# Patient Record
Sex: Male | Born: 1990
Health system: Southern US, Community
[De-identification: ages and names within clinical notes are randomized; demographics above are authoritative.]

## PROBLEM LIST (undated history)

## (undated) DIAGNOSIS — S82101B Unspecified fracture of upper end of right tibia, initial encounter for open fracture type I or II: Secondary | ICD-10-CM

## (undated) DIAGNOSIS — F32A Depression, unspecified: Secondary | ICD-10-CM

## (undated) DIAGNOSIS — S7292XA Unspecified fracture of left femur, initial encounter for closed fracture: Secondary | ICD-10-CM

## (undated) DIAGNOSIS — S82452A Displaced comminuted fracture of shaft of left fibula, initial encounter for closed fracture: Secondary | ICD-10-CM

## (undated) DIAGNOSIS — Z889 Allergy status to unspecified drugs, medicaments and biological substances status: Secondary | ICD-10-CM

## (undated) DIAGNOSIS — T8859XA Other complications of anesthesia, initial encounter: Secondary | ICD-10-CM

## (undated) DIAGNOSIS — Z973 Presence of spectacles and contact lenses: Secondary | ICD-10-CM

## (undated) DIAGNOSIS — T4145XA Adverse effect of unspecified anesthetic, initial encounter: Secondary | ICD-10-CM

## (undated) DIAGNOSIS — K219 Gastro-esophageal reflux disease without esophagitis: Secondary | ICD-10-CM

## (undated) DIAGNOSIS — F329 Major depressive disorder, single episode, unspecified: Secondary | ICD-10-CM

## (undated) DIAGNOSIS — F909 Attention-deficit hyperactivity disorder, unspecified type: Secondary | ICD-10-CM

## (undated) DIAGNOSIS — F419 Anxiety disorder, unspecified: Secondary | ICD-10-CM

## (undated) DIAGNOSIS — I10 Essential (primary) hypertension: Secondary | ICD-10-CM

## (undated) DIAGNOSIS — B86 Scabies: Secondary | ICD-10-CM

## (undated) HISTORY — DX: Attention-deficit hyperactivity disorder, unspecified type: F90.9

## (undated) HISTORY — DX: Depression, unspecified: F32.A

## (undated) HISTORY — DX: Anxiety disorder, unspecified: F41.9

## (undated) HISTORY — DX: Essential (primary) hypertension: I10

## (undated) HISTORY — PX: LEG SURGERY: SHX1003

## (undated) HISTORY — PX: WISDOM TOOTH EXTRACTION: SHX21

## (undated) HISTORY — DX: Scabies: B86

---
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
## 2011-12-24 ENCOUNTER — Ambulatory Visit (INDEPENDENT_AMBULATORY_CARE_PROVIDER_SITE_OTHER): Payer: 59 | Admitting: Family Medicine

## 2011-12-24 VITALS — BP 136/79 | HR 92 | Temp 97.9°F | Resp 18 | Ht 72.0 in | Wt 170.0 lb

## 2011-12-24 DIAGNOSIS — J45991 Cough variant asthma: Secondary | ICD-10-CM

## 2011-12-24 DIAGNOSIS — J019 Acute sinusitis, unspecified: Secondary | ICD-10-CM

## 2011-12-24 DIAGNOSIS — J45909 Unspecified asthma, uncomplicated: Secondary | ICD-10-CM

## 2011-12-24 DIAGNOSIS — Q676 Pectus excavatum: Secondary | ICD-10-CM

## 2011-12-24 DIAGNOSIS — I10 Essential (primary) hypertension: Secondary | ICD-10-CM

## 2011-12-24 MED ORDER — AMOXICILLIN 875 MG PO TABS: 875.0000 mg | ORAL_TABLET | Freq: Two times a day (BID) | ORAL | Status: AC

## 2011-12-24 MED ORDER — FLUTICASONE PROPIONATE 50 MCG/ACT NA SUSP: NASAL | Status: DC

## 2011-12-24 MED ORDER — ALBUTEROL SULFATE HFA 108 (90 BASE) MCG/ACT IN AERS: 2.0000 | INHALATION_SPRAY | Freq: Four times a day (QID) | RESPIRATORY_TRACT | Status: DC | PRN

## 2011-12-24 NOTE — Progress Notes (Signed)
  Subjective:    Patient ID: Noah Duke, male    DOB: [birth year], 21 y.o.   MRN: [id number]  HPI    Review of Systems     Objective:   Physical Exam        Assessment & Plan:

## 2011-12-24 NOTE — Patient Instructions (Addendum)
Take medications as ordered. Drink lots of fluids. Return if worse. Advise considering going to one of the specialty centers to have the pectus excavatum evaluated some time.

## 2011-12-24 NOTE — Progress Notes (Signed)
Subjective: Patient has been having problems with an upper respiratory congestion for about 10 days. He blood a lot of mucus out. His ears are uncomfortable. His though does have a lot of drainage down. He has been wheezing and coughing a lot.  He also a lot of questions about his pectus excavatum. He has read up on it. He does look more prominent and bothering him more since he has been lifting weights and developing more pectoralis muscles.  Objective: This white male obviously uncomfortable with a lot of nasal drainage coughing and blowing his nose. His TMs are normal. Nose congested. Throat clear. Wears braces. Neck supple without significant nodes. Chest is clear to percussion and auscultation there is wheezing bilaterally though he has fairly good air exchange.  Prominent pectus excavatum very deep at the low sternum.  Assessment: Asthmatic bronchitis Sinusitis Pectus excavatum  Plan: Discussed with patient the pectus. Advised him considering going to a specialist about the [facility] some time. He can research online and find someone who has experience with this. If he cannot find such she is to get back to me.  We'll treat his symptoms with antibiotics nasal inhalers and pulmonary inhalers.

## 2012-09-30 ENCOUNTER — Other Ambulatory Visit: Payer: Self-pay | Admitting: Internal Medicine

## 2012-09-30 ENCOUNTER — Other Ambulatory Visit: Payer: 59 | Admitting: Internal Medicine

## 2012-09-30 DIAGNOSIS — Z0000 Encounter for general adult medical examination without abnormal findings: Secondary | ICD-10-CM

## 2012-09-30 LAB — COMPREHENSIVE METABOLIC PANEL
Albumin: 4.6 g/dL (ref 3.5–5.2)
BUN: 17 mg/dL (ref 6–23)
Calcium: 9.9 mg/dL (ref 8.4–10.5)
Chloride: 101 mEq/L (ref 96–112)
Glucose, Bld: 65 mg/dL — ABNORMAL LOW (ref 70–99)
Potassium: 4.2 mEq/L (ref 3.5–5.3)

## 2012-09-30 LAB — LIPID PANEL
Cholesterol: 161 mg/dL (ref 0–200)
HDL: 57 mg/dL (ref >39)
Triglycerides: 53 mg/dL (ref <150)

## 2012-10-01 ENCOUNTER — Encounter: Payer: Self-pay | Admitting: Internal Medicine

## 2012-10-01 ENCOUNTER — Ambulatory Visit (INDEPENDENT_AMBULATORY_CARE_PROVIDER_SITE_OTHER): Payer: 59 | Admitting: Internal Medicine

## 2012-10-01 VITALS — BP 128/78 | HR 72 | Temp 98.8°F | Ht 69.75 in | Wt 179.0 lb

## 2012-10-01 DIAGNOSIS — Z202 Contact with and (suspected) exposure to infections with a predominantly sexual mode of transmission: Secondary | ICD-10-CM

## 2012-10-01 DIAGNOSIS — Z23 Encounter for immunization: Secondary | ICD-10-CM

## 2012-10-01 DIAGNOSIS — Z2089 Contact with and (suspected) exposure to other communicable diseases: Secondary | ICD-10-CM

## 2012-10-01 LAB — CBC WITH DIFFERENTIAL/PLATELET
Basophils Relative: 1 % (ref 0–1)
HCT: 40.3 % (ref 39.0–52.0)
Hemoglobin: 13.5 g/dL (ref 13.0–17.0)
Lymphocytes Relative: 42 % (ref 12–46)
MCHC: 33.5 g/dL (ref 30.0–36.0)
Monocytes Absolute: 0.7 10*3/uL (ref 0.1–1.0)
Monocytes Relative: 12 % (ref 3–12)
Neutro Abs: 2.7 10*3/uL (ref 1.7–7.7)

## 2012-10-01 MED ORDER — TETANUS-DIPHTH-ACELL PERTUSSIS 5-2.5-18.5 LF-MCG/0.5 IM SUSP: 0.5000 mL | Freq: Once | INTRAMUSCULAR | Status: DC

## 2012-10-05 NOTE — Progress Notes (Signed)
  Subjective:    Patient ID: Noah Duke, male    DOB: 1991-07-08, 21 y.o.   MRN: [id number]  HPI 21 year old white male presents to office for the first time today. Son of [place] and [place]. No history of serious illnesses accidents or operations. No known drug allergies. Has seasonal allergic rhinitis. No chronic medications. Patient enjoys weightlifting and exercises at a gym for 5 days a week. Resides with his parents in [place]. Completed high school.  Does not smoke or consume alcohol. Enjoys weightlifting.  Family history: Father with history of hypertension, hyperlipidemia. Mother with history of multiple sclerosis. One brother and one sister both adults in good health.  Patient currently not in school. Recently had a job working in [employer] on [address] [employer].    Review of Systems  Constitutional: Negative.   All other systems reviewed and are negative.       Objective:   Physical Exam  Vitals reviewed. Constitutional: He is oriented to person, place, and time. He appears well-developed and well-nourished. No distress.  HENT:  Head: Normocephalic and atraumatic.  Right Ear: External ear normal.  Left Ear: External ear normal.  Mouth/Throat: Oropharynx is clear and moist.  Eyes: Conjunctivae normal and EOM are normal. Pupils are equal, round, and reactive to light. Right eye exhibits no discharge. Left eye exhibits no discharge. No scleral icterus.  Neck: Neck supple. No JVD present. No thyromegaly present.  Cardiovascular: Normal rate, regular rhythm, normal heart sounds and intact distal pulses.   No murmur heard. Pulmonary/Chest: Effort normal. He has no wheezes. He has no rales.  Abdominal: Soft. Bowel sounds are normal. He exhibits no distension and no mass. There is no tenderness. There is no rebound and no guarding.  Genitourinary: Penis normal.       No hernias to direct palpation  Lymphadenopathy:    He has no cervical adenopathy.  Neurological: He  is alert and oriented to person, place, and time. He has normal reflexes. No cranial nerve deficit. Coordination normal.  Skin: Skin is warm and dry. No rash noted. He is not diaphoretic.  Psychiatric: He has a normal mood and affect. His behavior is normal. Judgment and thought content normal.          Assessment & Plan:  Normal health maintenance exam  Plan: Gardasil #1 given. Return in 2 months for second Gardisil vaccine.Tdap given.

## 2012-10-05 NOTE — Patient Instructions (Addendum)
Return in 2 months for second Gardasil vaccine

## 2012-12-03 ENCOUNTER — Ambulatory Visit: Payer: 59 | Admitting: Internal Medicine

## 2012-12-04 ENCOUNTER — Telehealth: Payer: Self-pay | Admitting: Internal Medicine

## 2012-12-04 NOTE — Telephone Encounter (Signed)
Patient failed to keep appointment for Gardisil vaccine #2 injection. Was contacted regarding this. Father answered cell phone given on file.  Explained pt had missed appt and needed to call office to reschedule.

## 2014-05-22 ENCOUNTER — Ambulatory Visit: Payer: 59 | Admitting: Internal Medicine

## 2016-02-20 ENCOUNTER — Emergency Department (HOSPITAL_COMMUNITY)
Admission: EM | Admit: 2016-02-20 | Discharge: 2016-02-20 | Disposition: A | Discharge status: Home / Self Care | Payer: Commercial Managed Care - HMO | Attending: Emergency Medicine | Admitting: Emergency Medicine

## 2016-02-20 ENCOUNTER — Emergency Department (HOSPITAL_COMMUNITY): Payer: Commercial Managed Care - HMO

## 2016-02-20 ENCOUNTER — Encounter (HOSPITAL_COMMUNITY): Payer: Self-pay

## 2016-02-20 DIAGNOSIS — Y9289 Other specified places as the place of occurrence of the external cause: Secondary | ICD-10-CM | POA: Diagnosis not present

## 2016-02-20 DIAGNOSIS — S62318A Displaced fracture of base of other metacarpal bone, initial encounter for closed fracture: Secondary | ICD-10-CM

## 2016-02-20 DIAGNOSIS — Z7951 Long term (current) use of inhaled steroids: Secondary | ICD-10-CM | POA: Diagnosis not present

## 2016-02-20 DIAGNOSIS — W228XXA Striking against or struck by other objects, initial encounter: Secondary | ICD-10-CM | POA: Insufficient documentation

## 2016-02-20 DIAGNOSIS — S62326A Displaced fracture of shaft of fifth metacarpal bone, right hand, initial encounter for closed fracture: Secondary | ICD-10-CM | POA: Insufficient documentation

## 2016-02-20 DIAGNOSIS — Y998 Other external cause status: Secondary | ICD-10-CM | POA: Diagnosis not present

## 2016-02-20 DIAGNOSIS — Z79899 Other long term (current) drug therapy: Secondary | ICD-10-CM | POA: Insufficient documentation

## 2016-02-20 DIAGNOSIS — S6991XA Unspecified injury of right wrist, hand and finger(s), initial encounter: Secondary | ICD-10-CM | POA: Diagnosis present

## 2016-02-20 DIAGNOSIS — Y9389 Activity, other specified: Secondary | ICD-10-CM | POA: Insufficient documentation

## 2016-02-20 DIAGNOSIS — Z87891 Personal history of nicotine dependence: Secondary | ICD-10-CM | POA: Insufficient documentation

## 2016-02-20 MED ORDER — OXYCODONE-ACETAMINOPHEN 5-325 MG PO TABS
1.0000 | ORAL_TABLET | Freq: Once | ORAL | Status: AC
  Administered 2016-02-20: 1 via ORAL
  Filled 2016-02-20: qty 1

## 2016-02-20 MED ORDER — OXYCODONE-ACETAMINOPHEN 5-325 MG PO TABS
1.0000 | ORAL_TABLET | Freq: Three times a day (TID) | ORAL | Status: DC | PRN
Start: 2016-02-20 — End: 2016-03-02

## 2016-02-20 NOTE — Progress Notes (Signed)
Orthopedic Tech Progress Note Patient Details:  KINCADE GRANBERG 06-16-1991 [id number] Applied fiberglass ulnar gutter splint to RUE.  Pulses, sensation, motion intact before and after splinting.  Capillary refill less than 2 seconds before and after splinting. Ortho Devices Type of Ortho Device: Ulna gutter splint Ortho Device/Splint Location: RUE Ortho Device/Splint Interventions: Application   Lesle Chris 02/20/2016, 10:42 PM

## 2016-02-20 NOTE — ED Notes (Signed)
Patient transported to X-ray 

## 2016-02-20 NOTE — Discharge Instructions (Signed)
Please read and follow all provided instructions.  Your diagnoses today include:  1. Fracture of fifth metacarpal bone, closed, initial encounter    Tests performed today include:  Vital signs. See below for your results today.   Medications prescribed:   Take as prescribed  You have been prescribed a narcotic medication on an "as needed" basis. Take only as prescribed. Do not drive, operate any machinery or make any important decisions while taking this medication as it is sedating. It may cause constipation take over the counter stool softeners or add fiber to your diet to treat this (Metamucil, Psyllium Fiber, Colace, Miralax) Further refills will need to be obtained from your primary care doctor and will not be prescribed through the Emergency Department. You will test positive on most drug tests while taking this medciation.  You can use Ibuprofen 400mg  combined with Tylenol 1000mg  for pain relief every 6 hours. Do not exceed 4g of Tylenol in one 24 hour period. Use narcotics if pain uncontrolled with the aforementioned regiment.    Home care instructions:  Follow any educational materials contained in this packet.  Follow-up instructions: Please follow-up with hand surgery for further evaluation of symptoms and treatment. Call and make an appointment tomorrow morning    Return instructions:   Please return to the Emergency Department if you do not get better, if you get worse, or new symptoms OR  - Fever (temperature greater than 101.50F)  - Bleeding that does not stop with holding pressure to the area    -Severe pain (please note that you may be more sore the day after your accident)  - Chest Pain  - Difficulty breathing  - Severe nausea or vomiting  - Inability to tolerate food and liquids  - Passing out  - Skin becoming red around your wounds  - Change in mental status (confusion or lethargy)  - New numbness or weakness     Please return if you have any other emergent  concerns.  Additional Information:  Your vital signs today were: BP 159/77 mmHg   Pulse 77   Temp(Src) 98.2 F (36.8 C) (Oral)   Resp 20   Ht 5\' 11"  (1.803 m)   Wt 80.287 kg   BMI 24.70 kg/m2   SpO2 99% If your blood pressure (BP) was elevated above 135/85 this visit, please have this repeated by your doctor within one month. ---------------

## 2016-02-20 NOTE — ED Notes (Signed)
Pt states he believes he broke his right hand about 2am by punching a door; pt states he notice swelling; Pt c/o of pain at 10/10 on arrival; pt states unable to move fingers;

## 2016-02-20 NOTE — ED Provider Notes (Signed)
CSN: [id number]     Arrival date & time 02/20/16  2026 History  By signing my name below, I, Noah Duke, attest that this documentation has been prepared under the direction and in the presence of Audry Pili, PA-C. Electronically Signed: Doreatha Duke, ED Scribe. 02/20/2016. 8:58 PM.    Chief Complaint  Patient presents with  . Hand Injury   The history is provided by the patient. No language interpreter was used.   HPI Comments: Noah Duke is a 25 y.o. male who presents to the Emergency Department complaining of moderate right hand pain and swelling s/p injury last night. Pt states that he punched a door last night with a closed fist, causing his injury. He is able to move the first 3 fingers without difficulty, but finds more difficulty moving the pinky secondary to pain. He states he has tried ibuprofen, acetaminophen, ice and heat with mild to moderate relief of pain. Pt states pain is worsened with movement. Denies fever, numbness, additional injuries.   History reviewed. No pertinent past medical history. History reviewed. No pertinent past surgical history. History reviewed. No pertinent family history. Social History  Substance Use Topics  . Smoking status: Former Smoker -- 1.00 packs/day for 1 years    Types: Cigarettes    Quit date: 12/23/2010  . Smokeless tobacco: None  . Alcohol Use: No    Review of Systems A complete 10 system review of systems was obtained and all systems are negative except as noted in the HPI and PMH.    Allergies  Review of patient's allergies indicates no known allergies.  Home Medications   Prior to Admission medications   Medication Sig Start Date End Date Taking? Authorizing Provider  albuterol (PROVENTIL HFA;VENTOLIN HFA) 108 (90 BASE) MCG/ACT inhaler Inhale 2 puffs into the lungs every 6 (six) hours as needed for wheezing. 12/24/11 12/23/12  Peyton Najjar, MD  fluticasone [facility]) 50 MCG/ACT nasal spray Use 2 sprays each nostril twice  daily for 3 days then cut back to just once daily 12/24/11   Peyton Najjar, MD   BP 159/77 mmHg  Pulse 77  Temp(Src) 98.2 F (36.8 C) (Oral)  Resp 20  Ht  (1.803 m)  Wt 177 lb (80.287 kg)  BMI 24.70 kg/m2  SpO2 99% Physical Exam  Constitutional: He is oriented to person, place, and time. He appears well-developed and well-nourished.  HENT:  Head: Normocephalic.  Eyes: Conjunctivae are normal.  Cardiovascular: Normal rate.   Pulmonary/Chest: Effort normal. No respiratory distress.  Abdominal: He exhibits no distension.  Musculoskeletal: Normal range of motion. He exhibits edema and tenderness.  Tenderness over the 4th and 5th MCPs with surrounding edema. Capillary refill less than 3 seconds. Radial pulse 2+. Able to flex and extend the wrist and fingers. Sensation equal and intact bilaterally.   Neurological: He is alert and oriented to person, place, and time.  Skin: Skin is warm and dry.  Psychiatric: He has a normal mood and affect. His behavior is normal.  Nursing note and vitals reviewed.   ED Course  Procedures (including critical care time) DIAGNOSTIC STUDIES: Oxygen Saturation is 99% on RA, normal by my interpretation.    COORDINATION OF CARE: 8:52 PM Discussed treatment plan with pt at bedside which includes XR and pt agreed to plan.   Imaging Review Dg Hand Complete Right  02/20/2016  CLINICAL DATA:  Right hand pain over the fifth metacarpal after punching wall last night. Initial encounter. EXAM: RIGHT HAND -  COMPLETE 3+ VIEW COMPARISON:  None. FINDINGS: Acute fifth metacarpal shaft fracture with apex posterior and medial angulation. Angulation measures up to 55 degrees on oblique radiograph. No dislocation. No opaque foreign body. Associated soft tissue swelling. IMPRESSION: Angulated 5th metacarpal shaft fracture. Electronically Signed   By: Marnee Spring M.D.   On: 02/20/2016 21:23   I have personally reviewed and evaluated these images as part of my  medical decision-making.   MDM  I have reviewed and evaluated the relevant imaging studies. I have reviewed the relevant previous healthcare records. I obtained HPI from historian.  10:13 PM Patient discussed with hand surgery, Dr. Merlyn Lot, who recommends ulnar guttar and f/u in office.   ED Course:  Assessment: Patient X-Ray showed angulated fifth metacarpal shaft fracture. Neurovascularly intact. Consult with Dr. Merlyn Lot who recommended splint and follow up in office. Patient given ulnar gutter while in ED, conservative therapy recommended and discussed. Patient will be discharged home & is agreeable with above plan. Returns precautions discussed. Pt appears safe for discharge.   Disposition/Plan:  DC home Additional Verbal discharge instructions given and discussed with patient.  Pt Instructed to f/u with ortho in the next week for evaluation and treatment of symptoms. Return precautions given Pt acknowledges and agrees with plan  Supervising Physician Rolland Porter, MD   Final diagnoses:  Fracture of fifth metacarpal bone, closed, initial encounter   I personally performed the services described in this documentation, which was scribed in my presence. The recorded information has been reviewed and is accurate.   Audry Pili, PA-C 02/20/16 2321  Rolland Porter, MD 03/04/16 [phone]

## 2016-03-01 ENCOUNTER — Other Ambulatory Visit: Payer: Self-pay | Admitting: Physician Assistant

## 2016-03-01 ENCOUNTER — Encounter (HOSPITAL_BASED_OUTPATIENT_CLINIC_OR_DEPARTMENT_OTHER): Payer: Self-pay | Admitting: *Deleted

## 2016-03-01 NOTE — Pre-Procedure Instructions (Signed)
Patient has not yet returned phone call. Spoke with mother and instructed to have pt be NPO after MN tonight, to arrive at 1030 tomorrow am, dress in loose clothing and bring insurance card and photo ID. Mother states pt has no allergies and takes adderall as only home med.

## 2016-03-01 NOTE — H&P (Signed)
Kanon is seen in consultation for his right hand.  He punched a door. Markedly angulated distal midshaft fifth metacarpal fracture right hand. He has been in a brace. He comes in to discuss definitive treatment.  Past medical, social and family history reviewed in detail on the patient questionnaire and signed.  Review of systems: As detailed in the HPI. All others reviewed and are negative.  EXAMINATION: Well developed, well nourished gentleman in no acute distress.  Alert and oriented x 3.  Lungs clear to auscultation bilaterally.  Heart sounds normal.  He has an obvious significant deformity from his fracture displacement. Closed injury. Neurovascularly intact.  The remaining history and general exam is reviewed.  PLAN:  This fracture needs to be reduced and fixed and he completely understands that.  The procedure, risks, benefits and complications were reviewed. I will do an open reduction internal fixation with a dorsal plate. We discussed what is involved operatively and how he needs to protect this post operative. Paper work completed. All questions answered. More than 25 minutes spent face to face covering all of this with the patient. I will see him at the time of operative intervention.  Loreta Ave, M.D.

## 2016-03-02 ENCOUNTER — Ambulatory Visit (HOSPITAL_BASED_OUTPATIENT_CLINIC_OR_DEPARTMENT_OTHER): Payer: Commercial Managed Care - HMO | Admitting: Anesthesiology

## 2016-03-02 ENCOUNTER — Encounter (HOSPITAL_BASED_OUTPATIENT_CLINIC_OR_DEPARTMENT_OTHER)
Admission: RE | Disposition: A | Discharge status: Home / Self Care | Payer: Self-pay | Source: Ambulatory Visit | Attending: Orthopedic Surgery

## 2016-03-02 ENCOUNTER — Encounter (HOSPITAL_BASED_OUTPATIENT_CLINIC_OR_DEPARTMENT_OTHER): Payer: Self-pay | Admitting: *Deleted

## 2016-03-02 ENCOUNTER — Ambulatory Visit (HOSPITAL_BASED_OUTPATIENT_CLINIC_OR_DEPARTMENT_OTHER)
Admission: RE | Admit: 2016-03-02 | Discharge: 2016-03-02 | Disposition: A | Discharge status: Home / Self Care | Payer: Commercial Managed Care - HMO | Source: Ambulatory Visit | Attending: Orthopedic Surgery | Admitting: Orthopedic Surgery

## 2016-03-02 DIAGNOSIS — X58XXXA Exposure to other specified factors, initial encounter: Secondary | ICD-10-CM | POA: Diagnosis not present

## 2016-03-02 DIAGNOSIS — S62326A Displaced fracture of shaft of fifth metacarpal bone, right hand, initial encounter for closed fracture: Secondary | ICD-10-CM | POA: Diagnosis present

## 2016-03-02 DIAGNOSIS — Z87891 Personal history of nicotine dependence: Secondary | ICD-10-CM | POA: Insufficient documentation

## 2016-03-02 HISTORY — DX: Allergy status to unspecified drugs, medicaments and biological substances: Z88.9

## 2016-03-02 HISTORY — PX: OPEN REDUCTION INTERNAL FIXATION (ORIF) METACARPAL: SHX6234

## 2016-03-02 SURGERY — OPEN REDUCTION INTERNAL FIXATION (ORIF) METACARPAL
Anesthesia: General | Site: Finger | Laterality: Right

## 2016-03-02 MED ORDER — HYDROMORPHONE HCL 1 MG/ML IJ SOLN
INTRAMUSCULAR | Status: AC
  Filled 2016-03-02: qty 1

## 2016-03-02 MED ORDER — MORPHINE SULFATE (PF) 10 MG/ML IV SOLN
INTRAVENOUS | Status: AC
  Filled 2016-03-02: qty 1

## 2016-03-02 MED ORDER — ONDANSETRON HCL 4 MG/2ML IJ SOLN
INTRAMUSCULAR | Status: AC
  Filled 2016-03-02: qty 2

## 2016-03-02 MED ORDER — PROPOFOL 10 MG/ML IV BOLUS
INTRAVENOUS | Status: DC | PRN
  Administered 2016-03-02: 200 mg via INTRAVENOUS

## 2016-03-02 MED ORDER — CHLORHEXIDINE GLUCONATE 4 % EX LIQD: 60.0000 mL | Freq: Once | CUTANEOUS | Status: DC

## 2016-03-02 MED ORDER — LACTATED RINGERS IV SOLN: INTRAVENOUS | Status: DC

## 2016-03-02 MED ORDER — FENTANYL CITRATE (PF) 100 MCG/2ML IJ SOLN
INTRAMUSCULAR | Status: AC
  Filled 2016-03-02: qty 2

## 2016-03-02 MED ORDER — ONDANSETRON HCL 4 MG/2ML IJ SOLN: 4.0000 mg | Freq: Four times a day (QID) | INTRAMUSCULAR | Status: DC | PRN

## 2016-03-02 MED ORDER — CEFAZOLIN SODIUM-DEXTROSE 2-4 GM/100ML-% IV SOLN
INTRAVENOUS | Status: AC
  Filled 2016-03-02: qty 100

## 2016-03-02 MED ORDER — HYDROMORPHONE HCL 1 MG/ML IJ SOLN: 0.5000 mg | INTRAMUSCULAR | Status: DC | PRN

## 2016-03-02 MED ORDER — SCOPOLAMINE 1 MG/3DAYS TD PT72: 1.0000 | MEDICATED_PATCH | Freq: Once | TRANSDERMAL | Status: DC | PRN

## 2016-03-02 MED ORDER — LIDOCAINE 2% (20 MG/ML) 5 ML SYRINGE
INTRAMUSCULAR | Status: AC
  Filled 2016-03-02: qty 5

## 2016-03-02 MED ORDER — OXYCODONE HCL 5 MG PO TABS
5.0000 mg | ORAL_TABLET | Freq: Once | ORAL | Status: AC | PRN
  Administered 2016-03-02: 5 mg via ORAL

## 2016-03-02 MED ORDER — OXYCODONE-ACETAMINOPHEN 5-325 MG PO TABS: 1.0000 | ORAL_TABLET | ORAL | Status: DC | PRN

## 2016-03-02 MED ORDER — ONDANSETRON HCL 4 MG PO TABS: 4.0000 mg | ORAL_TABLET | Freq: Three times a day (TID) | ORAL | Status: DC | PRN

## 2016-03-02 MED ORDER — ONDANSETRON HCL 4 MG PO TABS: 4.0000 mg | ORAL_TABLET | Freq: Four times a day (QID) | ORAL | Status: DC | PRN

## 2016-03-02 MED ORDER — METOCLOPRAMIDE HCL 5 MG PO TABS: 5.0000 mg | ORAL_TABLET | Freq: Three times a day (TID) | ORAL | Status: DC | PRN

## 2016-03-02 MED ORDER — BUPIVACAINE HCL (PF) 0.25 % IJ SOLN
INTRAMUSCULAR | Status: DC | PRN
  Administered 2016-03-02: 10 mL

## 2016-03-02 MED ORDER — LIDOCAINE HCL (CARDIAC) 20 MG/ML IV SOLN
INTRAVENOUS | Status: DC | PRN
  Administered 2016-03-02: 80 mg via INTRAVENOUS

## 2016-03-02 MED ORDER — MIDAZOLAM HCL 2 MG/2ML IJ SOLN
INTRAMUSCULAR | Status: AC
  Filled 2016-03-02: qty 2

## 2016-03-02 MED ORDER — MEPERIDINE HCL 25 MG/ML IJ SOLN: 6.2500 mg | INTRAMUSCULAR | Status: DC | PRN

## 2016-03-02 MED ORDER — MIDAZOLAM HCL 2 MG/2ML IJ SOLN
1.0000 mg | INTRAMUSCULAR | Status: DC | PRN
  Administered 2016-03-02: 2 mg via INTRAVENOUS

## 2016-03-02 MED ORDER — ONDANSETRON HCL 4 MG/2ML IJ SOLN
INTRAMUSCULAR | Status: DC | PRN
  Administered 2016-03-02: 4 mg via INTRAVENOUS

## 2016-03-02 MED ORDER — HYDROMORPHONE HCL 1 MG/ML IJ SOLN
0.2500 mg | INTRAMUSCULAR | Status: DC | PRN
  Administered 2016-03-02 (×2): 0.5 mg via INTRAVENOUS
  Administered 2016-03-02 (×2): 0.25 mg via INTRAVENOUS
  Administered 2016-03-02: 0.5 mg via INTRAVENOUS

## 2016-03-02 MED ORDER — MORPHINE SULFATE 10 MG/ML IJ SOLN
INTRAMUSCULAR | Status: DC | PRN
  Administered 2016-03-02: 3 mg via INTRAVENOUS

## 2016-03-02 MED ORDER — METHOCARBAMOL 500 MG PO TABS
500.0000 mg | ORAL_TABLET | Freq: Four times a day (QID) | ORAL | Status: DC | PRN
Start: 2016-03-02 — End: 2016-03-02

## 2016-03-02 MED ORDER — METOCLOPRAMIDE HCL 5 MG/ML IJ SOLN: 5.0000 mg | Freq: Three times a day (TID) | INTRAMUSCULAR | Status: DC | PRN

## 2016-03-02 MED ORDER — GLYCOPYRROLATE 0.2 MG/ML IJ SOLN: 0.2000 mg | Freq: Once | INTRAMUSCULAR | Status: DC | PRN

## 2016-03-02 MED ORDER — OXYCODONE HCL 5 MG PO TABS
ORAL_TABLET | ORAL | Status: AC
  Filled 2016-03-02: qty 1

## 2016-03-02 MED ORDER — FENTANYL CITRATE (PF) 100 MCG/2ML IJ SOLN
50.0000 ug | INTRAMUSCULAR | Status: DC | PRN
  Administered 2016-03-02: 100 ug via INTRAVENOUS

## 2016-03-02 MED ORDER — LACTATED RINGERS IV SOLN
INTRAVENOUS | Status: DC
  Administered 2016-03-02: 11:00:00 via INTRAVENOUS

## 2016-03-02 MED ORDER — DEXAMETHASONE SODIUM PHOSPHATE 4 MG/ML IJ SOLN
INTRAMUSCULAR | Status: DC | PRN
  Administered 2016-03-02: 10 mg via INTRAVENOUS

## 2016-03-02 MED ORDER — DEXAMETHASONE SODIUM PHOSPHATE 10 MG/ML IJ SOLN
INTRAMUSCULAR | Status: AC
  Filled 2016-03-02: qty 1

## 2016-03-02 MED ORDER — METHOCARBAMOL 1000 MG/10ML IJ SOLN: 500.0000 mg | Freq: Four times a day (QID) | INTRAVENOUS | Status: DC | PRN

## 2016-03-02 MED ORDER — CEFAZOLIN SODIUM-DEXTROSE 2-4 GM/100ML-% IV SOLN
2.0000 g | INTRAVENOUS | Status: AC
  Administered 2016-03-02: 2 g via INTRAVENOUS

## 2016-03-02 MED ORDER — OXYCODONE HCL 5 MG/5ML PO SOLN: 5.0000 mg | Freq: Once | ORAL | Status: AC | PRN

## 2016-03-02 SURGICAL SUPPLY — 70 items
BANDAGE ACE 3X5.8 VEL STRL LF (GAUZE/BANDAGES/DRESSINGS) ×2 IMPLANT
BANDAGE ACE 4X5 VEL STRL LF (GAUZE/BANDAGES/DRESSINGS) IMPLANT
BIT DRILL 100X2XQC STRL (BIT) IMPLANT
BIT DRILL QC 2.0X100 (BIT) ×3
BIT DRL 100X2XQC STRL (BIT) ×1
BLADE SURG 15 STRL LF DISP TIS (BLADE) ×1 IMPLANT
BLADE SURG 15 STRL SS (BLADE) ×3
BNDG CMPR 9X4 STRL LF SNTH (GAUZE/BANDAGES/DRESSINGS) ×1
BNDG COHESIVE 3X5 TAN STRL LF (GAUZE/BANDAGES/DRESSINGS) ×3 IMPLANT
BNDG ESMARK 4X9 LF (GAUZE/BANDAGES/DRESSINGS) ×2 IMPLANT
CANISTER SUCT 1200ML W/VALVE (MISCELLANEOUS) ×3 IMPLANT
COVER BACK TABLE 60X90IN (DRAPES) ×3 IMPLANT
DECANTER SPIKE VIAL GLASS SM (MISCELLANEOUS) IMPLANT
DRAPE EXTREMITY T 121X128X90 (DRAPE) ×3 IMPLANT
DRAPE OEC MINIVIEW 54X84 (DRAPES) ×2 IMPLANT
DRAPE U-SHAPE 47X51 STRL (DRAPES) IMPLANT
DURAPREP 26ML APPLICATOR (WOUND CARE) ×3 IMPLANT
ELECT REM PT RETURN 9FT ADLT (ELECTROSURGICAL) ×3
ELECTRODE REM PT RTRN 9FT ADLT (ELECTROSURGICAL) ×1 IMPLANT
GAUZE SPONGE 4X4 12PLY STRL (GAUZE/BANDAGES/DRESSINGS) ×3 IMPLANT
GAUZE XEROFORM 1X8 LF (GAUZE/BANDAGES/DRESSINGS) ×2 IMPLANT
GLOVE BIO SURGEON STRL SZ 6.5 (GLOVE) ×1 IMPLANT
GLOVE BIO SURGEONS STRL SZ 6.5 (GLOVE) ×1
GLOVE BIOGEL PI IND STRL 7.0 (GLOVE) ×1 IMPLANT
GLOVE BIOGEL PI IND STRL 8 (GLOVE) ×1 IMPLANT
GLOVE BIOGEL PI INDICATOR 7.0 (GLOVE) ×6
GLOVE BIOGEL PI INDICATOR 8 (GLOVE) ×2
GLOVE ECLIPSE 7.0 STRL STRAW (GLOVE) ×3 IMPLANT
GLOVE SURG ORTHO 8.0 STRL STRW (GLOVE) ×3 IMPLANT
GOWN STRL REUS W/ TWL LRG LVL3 (GOWN DISPOSABLE) ×2 IMPLANT
GOWN STRL REUS W/ TWL XL LVL3 (GOWN DISPOSABLE) ×1 IMPLANT
GOWN STRL REUS W/TWL LRG LVL3 (GOWN DISPOSABLE) ×6
GOWN STRL REUS W/TWL XL LVL3 (GOWN DISPOSABLE) ×6 IMPLANT
NDL HYPO 25X1 1.5 SAFETY (NEEDLE) IMPLANT
NEEDLE HYPO 25X1 1.5 SAFETY (NEEDLE) ×3 IMPLANT
NS IRRIG 1000ML POUR BTL (IV SOLUTION) ×3 IMPLANT
PACK BASIN DAY SURGERY FS (CUSTOM PROCEDURE TRAY) ×3 IMPLANT
PAD CAST 3X4 CTTN HI CHSV (CAST SUPPLIES) ×2 IMPLANT
PAD CAST 4YDX4 CTTN HI CHSV (CAST SUPPLIES) IMPLANT
PADDING CAST ABS 4INX4YD NS (CAST SUPPLIES) ×2
PADDING CAST ABS COTTON 4X4 ST (CAST SUPPLIES) ×1 IMPLANT
PADDING CAST COTTON 3X4 STRL (CAST SUPPLIES) ×6
PADDING CAST COTTON 4X4 STRL (CAST SUPPLIES)
PENCIL BUTTON HOLSTER BLD 10FT (ELECTRODE) ×3 IMPLANT
PLATE 1/4 TUB W/COL 4H (Plate) ×2 IMPLANT
SCREW CORT ST 2.7X10 (Screw) ×4 IMPLANT
SCREW CORTEX 2.7X12MM (Screw) ×2 IMPLANT
SCREW CORTEX 2.7X14MM (Screw) ×4 IMPLANT
SHEET MEDIUM DRAPE 40X70 STRL (DRAPES) ×3 IMPLANT
SLEEVE SCD COMPRESS KNEE MED (MISCELLANEOUS) IMPLANT
SPLINT PLASTER CAST XFAST 3X15 (CAST SUPPLIES) IMPLANT
SPLINT PLASTER CAST XFAST 4X15 (CAST SUPPLIES) IMPLANT
SPLINT PLASTER XTRA FAST SET 4 (CAST SUPPLIES) ×20
SPLINT PLASTER XTRA FASTSET 3X (CAST SUPPLIES)
SPONGE LAP 4X18 X RAY DECT (DISPOSABLE) IMPLANT
STAPLER VISISTAT 35W (STAPLE) IMPLANT
STOCKINETTE 4X48 STRL (DRAPES) ×3 IMPLANT
SUCTION FRAZIER HANDLE 10FR (MISCELLANEOUS) ×2
SUCTION TUBE FRAZIER 10FR DISP (MISCELLANEOUS) ×1 IMPLANT
SUT ETHILON 3 0 PS 1 (SUTURE) ×2 IMPLANT
SUT VIC AB 2-0 SH 27 (SUTURE) ×3
SUT VIC AB 2-0 SH 27XBRD (SUTURE) ×1 IMPLANT
SUT VIC AB 3-0 SH 27 (SUTURE)
SUT VIC AB 3-0 SH 27X BRD (SUTURE) IMPLANT
SYR BULB 3OZ (MISCELLANEOUS) ×3 IMPLANT
SYR CONTROL 10ML LL (SYRINGE) ×2 IMPLANT
TOWEL OR 17X24 6PK STRL BLUE (TOWEL DISPOSABLE) ×3 IMPLANT
TUBE CONNECTING 20'X1/4 (TUBING) ×1
TUBE CONNECTING 20X1/4 (TUBING) ×2 IMPLANT
UNDERPAD 30X30 (UNDERPADS AND DIAPERS) ×3 IMPLANT

## 2016-03-02 NOTE — Anesthesia Procedure Notes (Signed)
Procedure Name: LMA Insertion Performed by: Gaila Engebretsen W Pre-anesthesia Checklist: Patient identified, Emergency Drugs available, Suction available and Patient being monitored Patient Re-evaluated:Patient Re-evaluated prior to inductionOxygen Delivery Method: Circle System Utilized Preoxygenation: Pre-oxygenation with 100% oxygen Intubation Type: IV induction Ventilation: Mask ventilation without difficulty LMA: LMA inserted LMA Size: 5.0 Number of attempts: 1 Placement Confirmation: positive ETCO2 Tube secured with: Tape Dental Injury: Teeth and Oropharynx as per pre-operative assessment      

## 2016-03-02 NOTE — Discharge Instructions (Signed)
Do not remove splint.  Do not get splint wet.  May apply ice for up to 20 min at a time for pain and swelling.  Fu appt in one week  SEEK MEDICAL CARE IF: You have swelling of your calf or leg.  You develop shortness of breath or chest pain.  You have redness, swelling, or increasing pain in the wound.  There is pus or any unusual drainage coming from the surgical site.  You notice a bad smell coming from the surgical site or dressing.  The surgical site breaks open after sutures or staples have been removed.  There is persistent bleeding from the suture or staple line.  You are getting worse or are not improving.  You have any other questions or concerns.  SEEK IMMEDIATE MEDICAL CARE IF:  You have a fever.  You develop a rash.  You have difficulty breathing.  You develop any reaction or side effects to medicines given.  Your knee motion is decreasing rather than improving.  MAKE SURE YOU:  Understand these instructions.  Will watch your condition.  Will get help right away if you are not doing well or get worse.     Post Anesthesia Home Care Instructions  Activity: Get plenty of rest for the remainder of the day. A responsible adult should stay with you for 24 hours following the procedure.  For the next 24 hours, DO NOT: -Drive a car -Advertising copywriter -Drink alcoholic beverages -Take any medication unless instructed by your physician -Make any legal decisions or sign important papers.  Meals: Start with liquid foods such as gelatin or soup. Progress to regular foods as tolerated. Avoid greasy, spicy, heavy foods. If nausea and/or vomiting occur, drink only clear liquids until the nausea and/or vomiting subsides. Call your physician if vomiting continues.  Special Instructions/Symptoms: Your throat may feel dry or sore from the anesthesia or the breathing tube placed in your throat during surgery. If this causes discomfort, gargle with warm salt water. The discomfort should  disappear within 24 hours.  If you had a scopolamine patch placed behind your ear for the management of post- operative nausea and/or vomiting:  1. The medication in the patch is effective for 72 hours, after which it should be removed.  Wrap patch in a tissue and discard in the trash. Wash hands thoroughly with soap and water. 2. You may remove the patch earlier than 72 hours if you experience unpleasant side effects which may include dry mouth, dizziness or visual disturbances. 3. Avoid touching the patch. Wash your hands with soap and water after contact with the patch.

## 2016-03-02 NOTE — Anesthesia Preprocedure Evaluation (Signed)
Anesthesia Evaluation  Patient identified by MRN, date of birth, ID band Patient awake    Reviewed: Allergy & Precautions, NPO status , Patient's Chart, lab work & pertinent test results  Airway Mallampati: I  TM Distance: >3 FB Neck ROM: Full    Dental  (+) Teeth Intact, Dental Advisory Given   Pulmonary former smoker,  breath sounds clear to auscultation        Cardiovascular Rhythm:Regular Rate:Normal     Neuro/Psych    GI/Hepatic   Endo/Other    Renal/GU      Musculoskeletal   Abdominal   Peds  Hematology   Anesthesia Other Findings   Reproductive/Obstetrics                             Anesthesia Physical Anesthesia Plan  ASA: I  Anesthesia Plan: General   Post-op Pain Management:    Induction: Intravenous  Airway Management Planned: LMA  Additional Equipment:   Intra-op Plan:   Post-operative Plan: Extubation in OR  Informed Consent: I have reviewed the patients History and Physical, chart, labs and discussed the procedure including the risks, benefits and alternatives for the proposed anesthesia with the patient or authorized representative who has indicated his/her understanding and acceptance.   Dental advisory given  Plan Discussed with: CRNA, Anesthesiologist and Surgeon  Anesthesia Plan Comments:        Anesthesia Quick Evaluation  

## 2016-03-02 NOTE — Anesthesia Postprocedure Evaluation (Signed)
Anesthesia Post Note  Patient: Noah Duke  Procedure(s) Performed: Procedure(s) (LRB): OPEN REDUCTION INTERNAL FIXATION (ORIF) 5TH METACARPAL RIGHT LITTLE FINGER (Right)  Patient location during evaluation: PACU Anesthesia Type: General Level of consciousness: awake and alert Pain management: pain level controlled Vital Signs Assessment: post-procedure vital signs reviewed and stable Respiratory status: spontaneous breathing, nonlabored ventilation and respiratory function stable Cardiovascular status: blood pressure returned to baseline and stable Postop Assessment: no signs of nausea or vomiting Anesthetic complications: no    Last Vitals:  Filed Vitals:   03/02/16 1430 03/02/16 1504  BP:  145/95  Pulse: 71 66  Temp:  36.4 C  Resp: 16 16    Last Pain:  Filed Vitals:   03/02/16 1505  PainSc: 4                  Gabriella Guile A

## 2016-03-02 NOTE — Interval H&P Note (Signed)
History and Physical Interval Note:  03/02/2016 7:18 AM  Noah Duke  has presented today for surgery, with the diagnosis of Unspecified fracture of unspecified metacarpal bone, initial encounter for closed fracture  S62.309A  The various methods of treatment have been discussed with the patient and family. After consideration of risks, benefits and other options for treatment, the patient has consented to  Procedure(s): OPEN REDUCTION INTERNAL FIXATION (ORIF) 5TH METACARPAL RIGHT LITTLE FINGER (Right) as a surgical intervention .  The patient's history has been reviewed, patient examined, no change in status, stable for surgery.  I have reviewed the patient's chart and labs.  Questions were answered to the patient's satisfaction.     Loreta Ave

## 2016-03-02 NOTE — Transfer of Care (Signed)
Immediate Anesthesia Transfer of Care Note  Patient: Noah Duke  Procedure(s) Performed: Procedure(s): OPEN REDUCTION INTERNAL FIXATION (ORIF) 5TH METACARPAL RIGHT LITTLE FINGER (Right)  Patient Location: PACU  Anesthesia Type:General  Level of Consciousness: awake and sedated  Airway & Oxygen Therapy: Patient Spontanous Breathing  Post-op Assessment: Report given to RN and Post -op Vital signs reviewed and stable  Post vital signs: Reviewed and stable  Last Vitals:  Filed Vitals:   03/02/16 1104  BP: 126/69  Pulse: 83  Temp: 36.6 C  Resp: 18    Last Pain:  Filed Vitals:   03/02/16 1106  PainSc: 4          Complications: No apparent anesthesia complications

## 2016-03-05 ENCOUNTER — Encounter (HOSPITAL_BASED_OUTPATIENT_CLINIC_OR_DEPARTMENT_OTHER): Payer: Self-pay | Admitting: Orthopedic Surgery

## 2016-03-06 NOTE — Op Note (Signed)
NAMECHASKA, HAGGER NO.:  [id number]  MEDICAL RECORD NO.:  [id number]  LOCATION:                                FACILITY:  [facility]  PHYSICIAN:  Loreta Ave, M.D. DATE OF BIRTH:  Apr 28, 1991  DATE OF PROCEDURE:  03/02/2016 DATE OF DISCHARGE:  03/02/2016                              OPERATIVE REPORT   PREOPERATIVE DIAGNOSIS:  Displaced markedly angulated comminuted fracture, 5th metacarpal, right hand.  Junction middle distal shaft.  POSTOPERATIVE DIAGNOSIS:  Displaced markedly angulated comminuted fracture, 5th metacarpal, right hand.  Junction middle distal shaft.  PROCEDURE:  Open reduction and internal fixation utilizing a dorsal Synthes plate with 2.7 mm screws.  SURGEON:  Loreta Ave, MD  ASSISTANT:  Mikey Kirschner, PA  ANESTHESIA:  General.  BLOOD LOSS:  Minimal.  SPECIMENS:  None.  CULTURES:  None.  COMPLICATIONS:  None.  DRESSINGS:  Soft compressive bulky dressing was placed.  TOURNIQUET TIME:  1 hour 10 minutes.  DESCRIPTION OF PROCEDURE:  The patient was brought to the operating room, placed on the operating table in supine position.  After adequate anesthesia had been obtained, tourniquet applied, prepped and draped in usual sterile fashion.  Exsanguinated with elevation of Esmarch. Tourniquet inflated to 250 mmHg.  Fluoroscopic guidance throughout. Longitudinal incision, dorsal aspect, right hand over the 5th metacarpal.  Skin and subcutaneous tissue divided.  Extensor tendons elevated.  Subperiosteal exposure of the fracture.  Reduced anatomically, including rotation.  It was then fixed with a 4-hole plate placed dorsally.  This was anchored in place utilizing 2 screws proximal to distal of fracture.  At completion, I had a nice solid stable fixation, anatomic alignment, full passive motion.  Wound irrigated, closed with nylon. Margins were injected with Marcaine.  Sterile compressive dressing with bulky dressing and  splint applied.  Tourniquet deflated and removed. Anesthesia reversed.  Brought to the recovery room.  Tolerated the surgery well, no complications.     Loreta Ave, M.D.     DFM/MEDQ  D:  03/02/2016  T:  03/02/2016  Job:  [id number]

## 2016-11-10 ENCOUNTER — Ambulatory Visit (INDEPENDENT_AMBULATORY_CARE_PROVIDER_SITE_OTHER): Payer: Commercial Managed Care - HMO | Admitting: Physician Assistant

## 2016-11-10 VITALS — BP 128/70 | HR 94 | Temp 98.4°F | Resp 18 | Ht 70.0 in | Wt 180.0 lb

## 2016-11-10 DIAGNOSIS — Z113 Encounter for screening for infections with a predominantly sexual mode of transmission: Secondary | ICD-10-CM | POA: Diagnosis not present

## 2016-11-10 DIAGNOSIS — R238 Other skin changes: Secondary | ICD-10-CM

## 2016-11-10 MED ORDER — VALACYCLOVIR HCL 1 G PO TABS: ORAL_TABLET | ORAL | 3 refills | Status: DC

## 2016-11-10 NOTE — Patient Instructions (Signed)
     IF you received an x-ray today, you will receive an invoice from [facility]. Please contact  Radiology at [phone] with questions or concerns regarding your invoice.   IF you received labwork today, you will receive an invoice from [facility]. Please contact [facility] at [phone] with questions or concerns regarding your invoice.   Our billing staff will not be able to assist you with questions regarding bills from these companies.  You will be contacted with the lab results as soon as they are available. The fastest way to get your results is to activate your My Chart account. Instructions are located on the last page of this paperwork. If you have not heard from us regarding the results in 2 weeks, please contact this office.     

## 2016-11-10 NOTE — Progress Notes (Signed)
  11/10/2016 5:32 PM   DOB: [birth year] / MRN: [id number]  SUBJECTIVE:  Noah Duke is a 26 y.o. male presenting for clogged poors on his penis. He is sexually active with females only. Does not use protection as much as he should. Denies penile discharge, dysuria,  testicular pain, hematuria, bladder and abdominal pain.     He has No Known Allergies.   He  has a past medical history of H/O seasonal allergies.    He  reports that he quit smoking about 5 years ago. His smoking use included Cigarettes. He has a 1.00 pack-year smoking history. He does not have any smokeless tobacco history on file. He reports that he does not drink alcohol or use drugs. He  has no sexual activity history on file. The patient  has a past surgical history that includes Wisdom tooth extraction and Open reduction internal fixation (orif) metacarpal (Right, 03/02/2016).  His family history is not on file.  Review of Systems  Constitutional: Negative for chills and fever.  Gastrointestinal: Negative for abdominal pain and nausea.  Genitourinary: Negative for dysuria, frequency and urgency.    The problem list and medications were reviewed and updated by myself where necessary and exist elsewhere in the encounter.   OBJECTIVE:  BP 128/70   Pulse 94   Temp 98.4 F (36.9 C)   Resp 18   Ht 5\' 10"  (1.778 m)   Wt 180 lb (81.6 kg)   BMI 25.83 kg/m   Physical Exam  Cardiovascular: Normal rate and regular rhythm.   Pulmonary/Chest: Effort normal and breath sounds normal. No respiratory distress.  Genitourinary:       No results found for this or any previous visit (from the past 72 hour(s)).  No results found.  ASSESSMENT AND PLAN:  Lily was seen today for exposure to std.  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  Will treat with refills if recurrence.  -     valACYclovir (VALTREX) 1000 MG tablet; Take one tab every 8  hours for the next 5 days. If recurrence take 1 tab morning and night for 3 days.  Screening examination for STD (sexually transmitted disease) -     HIV antibody -     RPR -     GC/Chlamydia Probe Amp    The patient is advised to call or return to clinic if he does not see an improvement in symptoms, or to seek the care of the closest emergency department if he worsens with the above plan.   Deliah Boston, MHS, PA-C [facility] 11/10/2016 5:32 PM

## 2016-11-10 NOTE — Addendum Note (Signed)
Addended by: Rogelia Rohrer on: 11/10/2016 05:37 PM   Modules accepted: Orders

## 2016-11-11 LAB — HIV ANTIBODY (ROUTINE TESTING W REFLEX): HIV SCREEN 4TH GENERATION: NONREACTIVE

## 2016-11-11 LAB — RPR: RPR: NONREACTIVE

## 2016-11-13 LAB — GC/CHLAMYDIA PROBE AMP
CHLAMYDIA, DNA PROBE: NEGATIVE
NEISSERIA GONORRHOEAE BY PCR: NEGATIVE

## 2016-11-13 LAB — HERPES SIMPLEX VIRUS CULTURE

## 2016-11-13 LAB — SPECIMEN STATUS REPORT

## 2016-11-13 NOTE — Progress Notes (Signed)
Patient made aware via phone. Deliah Boston, MS, PA-C 9:00 AM, 11/13/2016

## 2017-01-31 ENCOUNTER — Ambulatory Visit: Payer: Commercial Managed Care - HMO

## 2017-03-14 ENCOUNTER — Emergency Department (HOSPITAL_COMMUNITY)
Admission: EM | Admit: 2017-03-14 | Discharge: 2017-03-15 | Disposition: A | Discharge status: Home / Self Care | Payer: Commercial Managed Care - HMO | Attending: Emergency Medicine | Admitting: Emergency Medicine

## 2017-03-14 ENCOUNTER — Emergency Department (HOSPITAL_COMMUNITY): Payer: Commercial Managed Care - HMO

## 2017-03-14 ENCOUNTER — Encounter (HOSPITAL_COMMUNITY): Payer: Self-pay | Admitting: Emergency Medicine

## 2017-03-14 DIAGNOSIS — S0990XA Unspecified injury of head, initial encounter: Secondary | ICD-10-CM

## 2017-03-14 DIAGNOSIS — Y939 Activity, unspecified: Secondary | ICD-10-CM | POA: Insufficient documentation

## 2017-03-14 DIAGNOSIS — Z87891 Personal history of nicotine dependence: Secondary | ICD-10-CM | POA: Insufficient documentation

## 2017-03-14 DIAGNOSIS — S6991XA Unspecified injury of right wrist, hand and finger(s), initial encounter: Secondary | ICD-10-CM | POA: Diagnosis not present

## 2017-03-14 DIAGNOSIS — Y929 Unspecified place or not applicable: Secondary | ICD-10-CM | POA: Insufficient documentation

## 2017-03-14 DIAGNOSIS — S6990XA Unspecified injury of unspecified wrist, hand and finger(s), initial encounter: Secondary | ICD-10-CM

## 2017-03-14 DIAGNOSIS — Y999 Unspecified external cause status: Secondary | ICD-10-CM | POA: Diagnosis not present

## 2017-03-14 DIAGNOSIS — R55 Syncope and collapse: Secondary | ICD-10-CM

## 2017-03-14 LAB — I-STAT CHEM 8, ED
BUN: 12 mg/dL (ref 6–20)
Calcium, Ion: 1.11 mmol/L — ABNORMAL LOW (ref 1.15–1.40)
Chloride: 100 mmol/L — ABNORMAL LOW (ref 101–111)
Creatinine, Ser: 1 mg/dL (ref 0.61–1.24)
Glucose, Bld: 98 mg/dL (ref 65–99)
HEMATOCRIT: 44 % (ref 39.0–52.0)
HEMOGLOBIN: 15 g/dL (ref 13.0–17.0)
POTASSIUM: 3.9 mmol/L (ref 3.5–5.1)
SODIUM: 138 mmol/L (ref 135–145)
TCO2: 29 mmol/L (ref 0–100)

## 2017-03-14 LAB — CBG MONITORING, ED: Glucose-Capillary: 87 mg/dL (ref 65–99)
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
## 2018-06-14 ENCOUNTER — Inpatient Hospital Stay (HOSPITAL_COMMUNITY): Payer: No Typology Code available for payment source

## 2018-06-14 ENCOUNTER — Emergency Department (HOSPITAL_COMMUNITY): Payer: No Typology Code available for payment source

## 2018-06-14 ENCOUNTER — Other Ambulatory Visit: Payer: Self-pay

## 2018-06-14 ENCOUNTER — Inpatient Hospital Stay (HOSPITAL_COMMUNITY): Payer: No Typology Code available for payment source | Admitting: Certified Registered"

## 2018-06-14 ENCOUNTER — Inpatient Hospital Stay (HOSPITAL_COMMUNITY)
Admission: EM | Admit: 2018-06-14 | Discharge: 2018-06-24 | DRG: 463 | Disposition: A | Discharge status: Home Health | Payer: No Typology Code available for payment source | Attending: General Surgery | Admitting: General Surgery

## 2018-06-14 ENCOUNTER — Encounter (HOSPITAL_COMMUNITY): Payer: Self-pay | Admitting: Emergency Medicine

## 2018-06-14 ENCOUNTER — Encounter (HOSPITAL_COMMUNITY): Admission: EM | Disposition: A | Discharge status: Home Health | Payer: Self-pay | Source: Home / Self Care

## 2018-06-14 DIAGNOSIS — Z9911 Dependence on respirator [ventilator] status: Secondary | ICD-10-CM

## 2018-06-14 DIAGNOSIS — I959 Hypotension, unspecified: Secondary | ICD-10-CM | POA: Diagnosis present

## 2018-06-14 DIAGNOSIS — S82201A Unspecified fracture of shaft of right tibia, initial encounter for closed fracture: Secondary | ICD-10-CM | POA: Insufficient documentation

## 2018-06-14 DIAGNOSIS — R402412 Glasgow coma scale score 13-15, at arrival to emergency department: Secondary | ICD-10-CM | POA: Diagnosis present

## 2018-06-14 DIAGNOSIS — S7292XA Unspecified fracture of left femur, initial encounter for closed fracture: Secondary | ICD-10-CM | POA: Diagnosis present

## 2018-06-14 DIAGNOSIS — S50312A Abrasion of left elbow, initial encounter: Secondary | ICD-10-CM | POA: Diagnosis present

## 2018-06-14 DIAGNOSIS — S72322A Displaced transverse fracture of shaft of left femur, initial encounter for closed fracture: Secondary | ICD-10-CM

## 2018-06-14 DIAGNOSIS — Z23 Encounter for immunization: Secondary | ICD-10-CM

## 2018-06-14 DIAGNOSIS — S30811A Abrasion of abdominal wall, initial encounter: Secondary | ICD-10-CM | POA: Diagnosis present

## 2018-06-14 DIAGNOSIS — S81012A Laceration without foreign body, left knee, initial encounter: Secondary | ICD-10-CM | POA: Diagnosis not present

## 2018-06-14 DIAGNOSIS — S82202A Unspecified fracture of shaft of left tibia, initial encounter for closed fracture: Secondary | ICD-10-CM | POA: Insufficient documentation

## 2018-06-14 DIAGNOSIS — Z9289 Personal history of other medical treatment: Secondary | ICD-10-CM

## 2018-06-14 DIAGNOSIS — R509 Fever, unspecified: Secondary | ICD-10-CM

## 2018-06-14 DIAGNOSIS — R000 Tachycardia, unspecified: Secondary | ICD-10-CM | POA: Diagnosis present

## 2018-06-14 DIAGNOSIS — K769 Liver disease, unspecified: Secondary | ICD-10-CM | POA: Diagnosis present

## 2018-06-14 DIAGNOSIS — R40241 Glasgow coma scale score 13-15, unspecified time: Secondary | ICD-10-CM | POA: Diagnosis present

## 2018-06-14 DIAGNOSIS — S82452A Displaced comminuted fracture of shaft of left fibula, initial encounter for closed fracture: Secondary | ICD-10-CM | POA: Diagnosis present

## 2018-06-14 DIAGNOSIS — Z4659 Encounter for fitting and adjustment of other gastrointestinal appliance and device: Secondary | ICD-10-CM

## 2018-06-14 DIAGNOSIS — Z419 Encounter for procedure for purposes other than remedying health state, unspecified: Secondary | ICD-10-CM

## 2018-06-14 DIAGNOSIS — D62 Acute posthemorrhagic anemia: Secondary | ICD-10-CM | POA: Diagnosis not present

## 2018-06-14 DIAGNOSIS — S82222A Displaced transverse fracture of shaft of left tibia, initial encounter for closed fracture: Secondary | ICD-10-CM

## 2018-06-14 DIAGNOSIS — S82102A Unspecified fracture of upper end of left tibia, initial encounter for closed fracture: Secondary | ICD-10-CM | POA: Diagnosis present

## 2018-06-14 DIAGNOSIS — J9690 Respiratory failure, unspecified, unspecified whether with hypoxia or hypercapnia: Secondary | ICD-10-CM

## 2018-06-14 DIAGNOSIS — R578 Other shock: Secondary | ICD-10-CM | POA: Diagnosis present

## 2018-06-14 DIAGNOSIS — R05 Cough: Secondary | ICD-10-CM

## 2018-06-14 DIAGNOSIS — S82101B Unspecified fracture of upper end of right tibia, initial encounter for open fracture type I or II: Secondary | ICD-10-CM

## 2018-06-14 DIAGNOSIS — S50311A Abrasion of right elbow, initial encounter: Secondary | ICD-10-CM | POA: Diagnosis present

## 2018-06-14 DIAGNOSIS — R059 Cough, unspecified: Secondary | ICD-10-CM

## 2018-06-14 DIAGNOSIS — S51012A Laceration without foreign body of left elbow, initial encounter: Secondary | ICD-10-CM | POA: Diagnosis present

## 2018-06-14 DIAGNOSIS — S82101C Unspecified fracture of upper end of right tibia, initial encounter for open fracture type IIIA, IIIB, or IIIC: Principal | ICD-10-CM | POA: Diagnosis present

## 2018-06-14 DIAGNOSIS — S72302A Unspecified fracture of shaft of left femur, initial encounter for closed fracture: Secondary | ICD-10-CM | POA: Diagnosis present

## 2018-06-14 DIAGNOSIS — S82401B Unspecified fracture of shaft of right fibula, initial encounter for open fracture type I or II: Secondary | ICD-10-CM | POA: Diagnosis present

## 2018-06-14 DIAGNOSIS — S72352A Displaced comminuted fracture of shaft of left femur, initial encounter for closed fracture: Secondary | ICD-10-CM | POA: Diagnosis present

## 2018-06-14 DIAGNOSIS — N179 Acute kidney failure, unspecified: Secondary | ICD-10-CM | POA: Diagnosis present

## 2018-06-14 DIAGNOSIS — S72402D Unspecified fracture of lower end of left femur, subsequent encounter for closed fracture with routine healing: Secondary | ICD-10-CM

## 2018-06-14 DIAGNOSIS — S82402A Unspecified fracture of shaft of left fibula, initial encounter for closed fracture: Secondary | ICD-10-CM | POA: Diagnosis present

## 2018-06-14 DIAGNOSIS — J9601 Acute respiratory failure with hypoxia: Secondary | ICD-10-CM | POA: Diagnosis present

## 2018-06-14 DIAGNOSIS — S82151C Displaced fracture of right tibial tuberosity, initial encounter for open fracture type IIIA, IIIB, or IIIC: Secondary | ICD-10-CM | POA: Diagnosis present

## 2018-06-14 DIAGNOSIS — T1490XA Injury, unspecified, initial encounter: Secondary | ICD-10-CM

## 2018-06-14 DIAGNOSIS — S99812A Other specified injuries of left ankle, initial encounter: Secondary | ICD-10-CM | POA: Diagnosis present

## 2018-06-14 DIAGNOSIS — S99912A Unspecified injury of left ankle, initial encounter: Secondary | ICD-10-CM | POA: Diagnosis present

## 2018-06-14 DIAGNOSIS — T148XXA Other injury of unspecified body region, initial encounter: Secondary | ICD-10-CM

## 2018-06-14 HISTORY — DX: Displaced comminuted fracture of shaft of left fibula, initial encounter for closed fracture: S82.452A

## 2018-06-14 HISTORY — PX: EXTERNAL FIXATION LEG: SHX1549

## 2018-06-14 HISTORY — DX: Unspecified fracture of upper end of right tibia, initial encounter for open fracture type I or II: S82.101B

## 2018-06-14 HISTORY — DX: Unspecified fracture of left femur, initial encounter for closed fracture: S72.92XA

## 2018-06-14 HISTORY — PX: I & D EXTREMITY: SHX5045

## 2018-06-14 LAB — URINALYSIS, ROUTINE W REFLEX MICROSCOPIC
Bacteria, UA: NONE SEEN
Bilirubin Urine: NEGATIVE
GLUCOSE, UA: NEGATIVE mg/dL
KETONES UR: NEGATIVE mg/dL
Leukocytes, UA: NEGATIVE
NITRITE: NEGATIVE
PH: 7 (ref 5.0–8.0)
Protein, ur: NEGATIVE mg/dL
Specific Gravity, Urine: 1.041 — ABNORMAL HIGH (ref 1.005–1.030)

## 2018-06-14 LAB — I-STAT CHEM 8, ED
BUN: 12 mg/dL (ref 6–20)
Calcium, Ion: 1.12 mmol/L — ABNORMAL LOW (ref 1.15–1.40)
Chloride: 104 mmol/L (ref 98–111)
Creatinine, Ser: 1.4 mg/dL — ABNORMAL HIGH (ref 0.61–1.24)
Glucose, Bld: 119 mg/dL — ABNORMAL HIGH (ref 70–99)
HEMATOCRIT: 50 % (ref 39.0–52.0)
Hemoglobin: 17 g/dL (ref 13.0–17.0)
POTASSIUM: 4 mmol/L (ref 3.5–5.1)
Sodium: 140 mmol/L (ref 135–145)
TCO2: 19 mmol/L — AB (ref 22–32)

## 2018-06-14 LAB — COMPREHENSIVE METABOLIC PANEL
ALT: 49 U/L — AB (ref 0–44)
AST: 81 U/L — AB (ref 15–41)
Albumin: 4.6 g/dL (ref 3.5–5.0)
Alkaline Phosphatase: 35 U/L — ABNORMAL LOW (ref 38–126)
Anion gap: 21 — ABNORMAL HIGH (ref 5–15)
BILIRUBIN TOTAL: 1.7 mg/dL — AB (ref 0.3–1.2)
BUN: 11 mg/dL (ref 6–20)
CO2: 17 mmol/L — ABNORMAL LOW (ref 22–32)
CREATININE: 1.38 mg/dL — AB (ref 0.61–1.24)
Calcium: 10 mg/dL (ref 8.9–10.3)
Chloride: 103 mmol/L (ref 98–111)
GFR calc Af Amer: >60 mL/min (ref >60)
Glucose, Bld: 115 mg/dL — ABNORMAL HIGH (ref 70–99)
Potassium: 4.1 mmol/L (ref 3.5–5.1)
Sodium: 141 mmol/L (ref 135–145)
TOTAL PROTEIN: 7.8 g/dL (ref 6.5–8.1)

## 2018-06-14 LAB — CBC
HCT: 48.6 % (ref 39.0–52.0)
Hemoglobin: 15.9 g/dL (ref 13.0–17.0)
MCH: 32.1 pg (ref 26.0–34.0)
MCHC: 32.7 g/dL (ref 30.0–36.0)
MCV: 98.2 fL (ref 78.0–100.0)
PLATELETS: 337 10*3/uL (ref 150–400)
RBC: 4.95 MIL/uL (ref 4.22–5.81)
RDW: 12.4 % (ref 11.5–15.5)
WBC: 9.7 10*3/uL (ref 4.0–10.5)

## 2018-06-14 LAB — I-STAT CG4 LACTIC ACID, ED: Lactic Acid, Venous: 7.15 mmol/L (ref 0.5–1.9)

## 2018-06-14 LAB — ABO/RH: ABO/RH(D): A POS

## 2018-06-14 LAB — PROTIME-INR
INR: 0.99
PROTHROMBIN TIME: 13 s (ref 11.4–15.2)

## 2018-06-14 LAB — ETHANOL: Alcohol, Ethyl (B): 38 mg/dL — ABNORMAL HIGH (ref <10)

## 2018-06-14 SURGERY — IRRIGATION AND DEBRIDEMENT EXTREMITY
Anesthesia: General | Site: Leg Lower | Laterality: Right

## 2018-06-14 MED ORDER — ROCURONIUM 10MG/ML (10ML) SYRINGE FOR MEDFUSION PUMP - OPTIME
INTRAVENOUS | Status: DC | PRN
  Administered 2018-06-14 (×2): 100 mg via INTRAVENOUS
  Administered 2018-06-14: 50 mg via INTRAVENOUS

## 2018-06-14 MED ORDER — CEFAZOLIN SODIUM-DEXTROSE 2-4 GM/100ML-% IV SOLN
2.0000 g | Freq: Once | INTRAVENOUS | Status: AC
  Administered 2018-06-14: 2 g via INTRAVENOUS

## 2018-06-14 MED ORDER — KCL IN DEXTROSE-NACL 20-5-0.45 MEQ/L-%-% IV SOLN
INTRAVENOUS | Status: DC
  Administered 2018-06-14 – 2018-06-22 (×8): via INTRAVENOUS
  Filled 2018-06-14 (×9): qty 1000

## 2018-06-14 MED ORDER — FENTANYL BOLUS VIA INFUSION
50.0000 ug | INTRAVENOUS | Status: DC | PRN
  Administered 2018-06-15 – 2018-06-18 (×7): 50 ug via INTRAVENOUS
  Filled 2018-06-14: qty 50

## 2018-06-14 MED ORDER — CEFAZOLIN SODIUM-DEXTROSE 1-4 GM/50ML-% IV SOLN
1.0000 g | Freq: Three times a day (TID) | INTRAVENOUS | Status: DC
  Administered 2018-06-15 – 2018-06-18 (×11): 1 g via INTRAVENOUS
  Filled 2018-06-14 (×11): qty 50

## 2018-06-14 MED ORDER — VECURONIUM BROMIDE 10 MG IV SOLR
INTRAVENOUS | Status: AC
  Filled 2018-06-14: qty 10

## 2018-06-14 MED ORDER — HYDRALAZINE HCL 20 MG/ML IJ SOLN: 10.0000 mg | INTRAMUSCULAR | Status: DC | PRN

## 2018-06-14 MED ORDER — TETANUS-DIPHTH-ACELL PERTUSSIS 5-2.5-18.5 LF-MCG/0.5 IM SUSP
0.5000 mL | Freq: Once | INTRAMUSCULAR | Status: AC
  Administered 2018-06-14: 0.5 mL via INTRAMUSCULAR

## 2018-06-14 MED ORDER — FENTANYL CITRATE (PF) 100 MCG/2ML IJ SOLN
100.0000 ug | Freq: Once | INTRAMUSCULAR | Status: DC
  Filled 2018-06-14: qty 2

## 2018-06-14 MED ORDER — TRANEXAMIC ACID 1000 MG/10ML IV SOLN
1000.0000 mg | Freq: Once | INTRAVENOUS | Status: AC
  Administered 2018-06-14 (×2): 1000 mg via INTRAVENOUS
  Filled 2018-06-14: qty 10

## 2018-06-14 MED ORDER — SUFENTANIL CITRATE 50 MCG/ML IV SOLN
INTRAVENOUS | Status: DC | PRN
  Administered 2018-06-14 (×2): 10 ug via INTRAVENOUS

## 2018-06-14 MED ORDER — SODIUM CHLORIDE 0.9 % IV SOLN
INTRAVENOUS | Status: DC | PRN
  Administered 2018-06-17: 1000 mL via INTRAVENOUS
  Administered 2018-06-18: 250 mL via INTRAVENOUS

## 2018-06-14 MED ORDER — SODIUM CHLORIDE 0.9 % IV SOLN
INTRAVENOUS | Status: AC | PRN
  Administered 2018-06-14 (×2): 1000 mL via INTRAVENOUS

## 2018-06-14 MED ORDER — SODIUM CHLORIDE 0.9 % IV SOLN
INTRAVENOUS | Status: DC | PRN
  Administered 2018-06-14: 19:00:00 via INTRAVENOUS

## 2018-06-14 MED ORDER — VECURONIUM BROMIDE 10 MG IV SOLR
INTRAVENOUS | Status: AC
  Administered 2018-06-14: 10 mg
  Filled 2018-06-14: qty 10

## 2018-06-14 MED ORDER — CHLORHEXIDINE GLUCONATE 0.12% ORAL RINSE (MEDLINE KIT)
15.0000 mL | Freq: Two times a day (BID) | OROMUCOSAL | Status: DC
  Administered 2018-06-14 – 2018-06-18 (×8): 15 mL via OROMUCOSAL

## 2018-06-14 MED ORDER — FENTANYL CITRATE (PF) 100 MCG/2ML IJ SOLN: 50.0000 ug | Freq: Once | INTRAMUSCULAR | Status: DC

## 2018-06-14 MED ORDER — SUFENTANIL CITRATE 50 MCG/ML IV SOLN
INTRAVENOUS | Status: AC
  Filled 2018-06-14: qty 1

## 2018-06-14 MED ORDER — CEFAZOLIN SODIUM-DEXTROSE 1-4 GM/50ML-% IV SOLN
1.0000 g | Freq: Once | INTRAVENOUS | Status: DC
Start: 2018-06-14 — End: 2018-06-14

## 2018-06-14 MED ORDER — MIDAZOLAM HCL 5 MG/5ML IJ SOLN
INTRAMUSCULAR | Status: DC | PRN
  Administered 2018-06-14: 2 mg via INTRAVENOUS

## 2018-06-14 MED ORDER — FENTANYL CITRATE (PF) 100 MCG/2ML IJ SOLN
INTRAMUSCULAR | Status: AC
  Filled 2018-06-14: qty 2

## 2018-06-14 MED ORDER — MIDAZOLAM HCL 2 MG/2ML IJ SOLN
INTRAMUSCULAR | Status: AC
  Filled 2018-06-14: qty 6

## 2018-06-14 MED ORDER — PROPOFOL 1000 MG/100ML IV EMUL
INTRAVENOUS | Status: AC
  Administered 2018-06-14: 10 ug/kg/min via INTRAVENOUS
  Filled 2018-06-14: qty 100

## 2018-06-14 MED ORDER — FENTANYL 2500MCG IN NS 250ML (10MCG/ML) PREMIX INFUSION
25.0000 ug/h | INTRAVENOUS | Status: DC
  Administered 2018-06-14: 300 ug/h via INTRAVENOUS
  Administered 2018-06-15: 400 ug/h via INTRAVENOUS
  Administered 2018-06-16: 350 ug/h via INTRAVENOUS
  Administered 2018-06-16 (×2): 250 ug/h via INTRAVENOUS
  Administered 2018-06-17 – 2018-06-18 (×4): 400 ug/h via INTRAVENOUS
  Filled 2018-06-14 (×5): qty 250

## 2018-06-14 MED ORDER — CEFAZOLIN SODIUM-DEXTROSE 2-3 GM-%(50ML) IV SOLR
INTRAVENOUS | Status: DC | PRN
  Administered 2018-06-14: 2 g via INTRAVENOUS

## 2018-06-14 MED ORDER — TRANEXAMIC ACID 1000 MG/10ML IV SOLN
1000.0000 mg | INTRAVENOUS | Status: AC
  Administered 2018-06-14: 1000 mg via INTRAVENOUS
  Filled 2018-06-14: qty 10

## 2018-06-14 MED ORDER — SODIUM CHLORIDE 0.9 % IR SOLN
Status: DC | PRN
  Administered 2018-06-14: 1000 mL
  Administered 2018-06-14 (×2): 3000 mL

## 2018-06-14 MED ORDER — ORAL CARE MOUTH RINSE
15.0000 mL | OROMUCOSAL | Status: DC
  Administered 2018-06-15 – 2018-06-18 (×29): 15 mL via OROMUCOSAL

## 2018-06-14 MED ORDER — ETOMIDATE 2 MG/ML IV SOLN
INTRAVENOUS | Status: AC | PRN
  Administered 2018-06-14: 20 mg via INTRAVENOUS

## 2018-06-14 MED ORDER — STERILE WATER FOR INJECTION IJ SOLN
INTRAMUSCULAR | Status: AC
Start: 2018-06-14 — End: 2018-06-15
  Filled 2018-06-14: qty 10

## 2018-06-14 MED ORDER — FENTANYL 2500MCG IN NS 250ML (10MCG/ML) PREMIX INFUSION
25.0000 ug/h | INTRAVENOUS | Status: DC
  Administered 2018-06-14: 250 ug/h via INTRAVENOUS
  Filled 2018-06-14 (×6): qty 250

## 2018-06-14 MED ORDER — FAMOTIDINE IN NACL 20-0.9 MG/50ML-% IV SOLN
20.0000 mg | INTRAVENOUS | Status: DC
  Administered 2018-06-14 – 2018-06-20 (×6): 20 mg via INTRAVENOUS
  Filled 2018-06-14 (×6): qty 50

## 2018-06-14 MED ORDER — DOCUSATE SODIUM 50 MG/5ML PO LIQD
100.0000 mg | Freq: Two times a day (BID) | ORAL | Status: DC | PRN
  Administered 2018-06-18: 100 mg
  Filled 2018-06-14 (×2): qty 10

## 2018-06-14 MED ORDER — FENTANYL BOLUS VIA INFUSION
50.0000 ug | INTRAVENOUS | Status: DC | PRN
  Filled 2018-06-14: qty 50

## 2018-06-14 MED ORDER — SUCCINYLCHOLINE CHLORIDE 20 MG/ML IJ SOLN
INTRAMUSCULAR | Status: AC | PRN
Start: 2018-06-14 — End: 2018-06-14
  Administered 2018-06-14: 100 mg via INTRAVENOUS

## 2018-06-14 MED ORDER — FENTANYL CITRATE (PF) 100 MCG/2ML IJ SOLN
INTRAMUSCULAR | Status: AC | PRN
  Administered 2018-06-14: 100 ug via INTRAVENOUS

## 2018-06-14 MED ORDER — PROPOFOL 1000 MG/100ML IV EMUL
0.0000 ug/kg/min | INTRAVENOUS | Status: DC
  Administered 2018-06-14: 10 ug/kg/min via INTRAVENOUS
  Filled 2018-06-14 (×3): qty 100

## 2018-06-14 MED ORDER — PROPOFOL 500 MG/50ML IV EMUL
INTRAVENOUS | Status: DC | PRN
  Administered 2018-06-14: 50 ug/kg/min via INTRAVENOUS

## 2018-06-14 MED ORDER — STERILE WATER FOR INJECTION IJ SOLN
INTRAMUSCULAR | Status: AC
  Filled 2018-06-14: qty 10

## 2018-06-14 MED ORDER — LACTATED RINGERS IV SOLN
INTRAVENOUS | Status: DC | PRN
  Administered 2018-06-14 (×2): via INTRAVENOUS

## 2018-06-14 MED ORDER — IOHEXOL 300 MG/ML  SOLN
100.0000 mL | Freq: Once | INTRAMUSCULAR | Status: AC | PRN
Start: 2018-06-14 — End: 2018-06-14
  Administered 2018-06-14: 100 mL via INTRAVENOUS

## 2018-06-14 MED ORDER — PROPOFOL 1000 MG/100ML IV EMUL
0.0000 ug/kg/min | INTRAVENOUS | Status: DC
  Administered 2018-06-14: 30 ug/kg/min via INTRAVENOUS
  Administered 2018-06-14: 10 ug/kg/min via INTRAVENOUS
  Administered 2018-06-15: 50 ug/kg/min via INTRAVENOUS
  Administered 2018-06-15: 30 ug/kg/min via INTRAVENOUS
  Administered 2018-06-16: 35 ug/kg/min via INTRAVENOUS
  Administered 2018-06-16: 40 ug/kg/min via INTRAVENOUS
  Administered 2018-06-16 (×2): 30 ug/kg/min via INTRAVENOUS
  Administered 2018-06-17 (×3): 40 ug/kg/min via INTRAVENOUS
  Administered 2018-06-18 (×2): 50 ug/kg/min via INTRAVENOUS
  Filled 2018-06-14 (×12): qty 100

## 2018-06-14 MED ORDER — FENTANYL CITRATE (PF) 100 MCG/2ML IJ SOLN
50.0000 ug | Freq: Once | INTRAMUSCULAR | Status: AC
  Administered 2018-06-14: 50 ug via INTRAVENOUS

## 2018-06-14 MED ORDER — MIDAZOLAM HCL 2 MG/2ML IJ SOLN
INTRAMUSCULAR | Status: AC
  Filled 2018-06-14: qty 2

## 2018-06-14 MED ORDER — PROPOFOL 10 MG/ML IV BOLUS
INTRAVENOUS | Status: AC
  Filled 2018-06-14: qty 20

## 2018-06-14 SURGICAL SUPPLY — 87 items
ALCOHOL ISOPROPYL (RUBBING) (MISCELLANEOUS) ×1 IMPLANT
BANDAGE ACE 3X5.8 VEL STRL LF (GAUZE/BANDAGES/DRESSINGS) IMPLANT
BANDAGE ACE 4X5 VEL STRL LF (GAUZE/BANDAGES/DRESSINGS) ×3 IMPLANT
BANDAGE ACE 6X5 VEL STRL LF (GAUZE/BANDAGES/DRESSINGS) ×9 IMPLANT
BAR EXFX 300X11 NS LF (EXFIX) ×4
BAR EXFX 500X11 NS LF (EXFIX) ×4
BAR EXFX 600X11 NS LF (EXFIX) ×4
BAR GLASS FIBER EXFX 11X300 (EXFIX) ×2 IMPLANT
BAR GLASS FIBER EXFX 11X500 (EXFIX) ×2 IMPLANT
BAR GLASS FIBER EXFX 11X600 (EXFIX) ×2 IMPLANT
BIT DRILL CANN MED FLUTE 4.0 (BIT) IMPLANT
BIT DRILL ORANGE 4.0 LONG (BIT) ×1 IMPLANT
BNDG COHESIVE 1X5 TAN STRL LF (GAUZE/BANDAGES/DRESSINGS) IMPLANT
BNDG COHESIVE 4X5 TAN STRL (GAUZE/BANDAGES/DRESSINGS) ×4 IMPLANT
BNDG COHESIVE 6X5 TAN STRL LF (GAUZE/BANDAGES/DRESSINGS) ×6 IMPLANT
BNDG CONFORM 3 STRL LF (GAUZE/BANDAGES/DRESSINGS) IMPLANT
BNDG GAUZE ELAST 4 BULKY (GAUZE/BANDAGES/DRESSINGS) ×7 IMPLANT
BNDG GAUZE STRTCH 6 (GAUZE/BANDAGES/DRESSINGS) ×3 IMPLANT
CANISTER WOUNDNEG PRESSURE 500 (CANNISTER) ×1 IMPLANT
CLAMP BLUE BAR TO BAR (EXFIX) ×4 IMPLANT
CLAMP PIN BLUE 45MM (EXFIX) ×1 IMPLANT
CORDS BIPOLAR (ELECTRODE) IMPLANT
COVER SURGICAL LIGHT HANDLE (MISCELLANEOUS) ×4 IMPLANT
CUFF TOURNIQUET SINGLE 24IN (TOURNIQUET CUFF) IMPLANT
CUFF TOURNIQUET SINGLE 44IN (TOURNIQUET CUFF) IMPLANT
DRAPE C-ARM 42X72 X-RAY (DRAPES) ×2 IMPLANT
DRAPE C-ARMOR (DRAPES) ×4 IMPLANT
DRAPE EXTREMITY T 121X128X90 (DRAPE) ×1 IMPLANT
DRAPE IMP U-DRAPE 54X76 (DRAPES) ×2 IMPLANT
DRAPE INCISE IOBAN 66X45 STRL (DRAPES) ×1 IMPLANT
DRAPE U-SHAPE 47X51 STRL (DRAPES) ×3 IMPLANT
DRILL CANN 4.0MM (BIT) ×3
DRSG VAC ATS SM SENSATRAC (GAUZE/BANDAGES/DRESSINGS) ×1 IMPLANT
DURAPREP 26ML APPLICATOR (WOUND CARE) ×3 IMPLANT
ELECT REM PT RETURN 9FT ADLT (ELECTROSURGICAL) ×3
ELECTRODE REM PT RTRN 9FT ADLT (ELECTROSURGICAL) ×2 IMPLANT
GAUZE SPONGE 4X4 12PLY STRL (GAUZE/BANDAGES/DRESSINGS) ×6 IMPLANT
GAUZE XEROFORM 5X9 LF (GAUZE/BANDAGES/DRESSINGS) ×5 IMPLANT
GLOVE BIO SURGEON STRL SZ7 (GLOVE) ×2 IMPLANT
GLOVE BIOGEL PI IND STRL 7.0 (GLOVE) ×2 IMPLANT
GLOVE BIOGEL PI INDICATOR 7.0 (GLOVE) ×1
GLOVE ECLIPSE 7.0 STRL STRAW (GLOVE) ×3 IMPLANT
GLOVE SKINSENSE NS SZ7.5 (GLOVE) ×3
GLOVE SKINSENSE STRL SZ7.5 (GLOVE) ×4 IMPLANT
GLOVE SURG SYN 7.5  E (GLOVE) ×2
GLOVE SURG SYN 7.5 E (GLOVE) ×4 IMPLANT
GLOVE SURG SYN 7.5 PF PI (GLOVE) IMPLANT
GOWN STRL REIN XL XLG (GOWN DISPOSABLE) ×6 IMPLANT
HALF PIN 5.0X160 (EXFIX) ×2 IMPLANT
HANDPIECE INTERPULSE COAX TIP (DISPOSABLE)
HYDROGEN PEROXIDE 16OZ (MISCELLANEOUS) ×1 IMPLANT
KIT BASIN OR (CUSTOM PROCEDURE TRAY) ×3 IMPLANT
KIT TURNOVER KIT B (KITS) ×3 IMPLANT
MANIFOLD NEPTUNE II (INSTRUMENTS) ×3 IMPLANT
NEEDLE 22X1 1/2 (OR ONLY) (NEEDLE) IMPLANT
NS IRRIG 1000ML POUR BTL (IV SOLUTION) ×3 IMPLANT
PACK ORTHO EXTREMITY (CUSTOM PROCEDURE TRAY) ×3 IMPLANT
PAD ABD 8X10 STRL (GAUZE/BANDAGES/DRESSINGS) ×3 IMPLANT
PAD ARMBOARD 7.5X6 YLW CONV (MISCELLANEOUS) ×6 IMPLANT
PADDING CAST ABS 4INX4YD NS (CAST SUPPLIES) ×1
PADDING CAST ABS COTTON 4X4 ST (CAST SUPPLIES) ×2 IMPLANT
PADDING CAST COTTON 6X4 STRL (CAST SUPPLIES) ×9 IMPLANT
PIN CLAMP 2BAR 75MM BLUE (EXFIX) ×4 IMPLANT
PIN HALF ORANGE 5X200X45MM (EXFIX) ×4 IMPLANT
PIN HALF YELLOW 5X160X35 (EXFIX) ×4 IMPLANT
POST 30 DEG ANGLED 11MM (EXFIX) ×2 IMPLANT
SCRUB POVIDONE IODINE 4 OZ (MISCELLANEOUS) ×2 IMPLANT
SET HNDPC FAN SPRY TIP SCT (DISPOSABLE) IMPLANT
SET IRRIG Y TYPE TUR BLADDER L (SET/KITS/TRAYS/PACK) ×2 IMPLANT
SLEEVE SURGEON STRL (DRAPES) ×1 IMPLANT
SOL PREP POV-IOD 4OZ 10% (MISCELLANEOUS) ×2 IMPLANT
SPONGE LAP 18X18 X RAY DECT (DISPOSABLE) ×4 IMPLANT
STAPLER VISISTAT 35W (STAPLE) IMPLANT
STOCKINETTE IMPERVIOUS 9X36 MD (GAUZE/BANDAGES/DRESSINGS) ×4 IMPLANT
STOCKINETTE IMPERVIOUS LG (DRAPES) ×3 IMPLANT
SUT ETHILON 2 0 FS 18 (SUTURE) ×9 IMPLANT
SUT ETHILON 2 0 PSLX (SUTURE) ×3 IMPLANT
SUT VIC AB 2-0 FS1 27 (SUTURE) ×6 IMPLANT
SYR CONTROL 10ML LL (SYRINGE) IMPLANT
TOWEL OR 17X24 6PK STRL BLUE (TOWEL DISPOSABLE) ×6 IMPLANT
TOWEL OR 17X26 10 PK STRL BLUE (TOWEL DISPOSABLE) ×6 IMPLANT
TUBE CONNECTING 12X1/4 (SUCTIONS) ×3 IMPLANT
TUBE FEEDING ENTERAL 5FR 16IN (TUBING) IMPLANT
TUBING CYSTO DISP (UROLOGICAL SUPPLIES) ×3 IMPLANT
UNDERPAD 30X30 (UNDERPADS AND DIAPERS) ×6 IMPLANT
WATER STERILE IRR 1000ML POUR (IV SOLUTION) ×6 IMPLANT
YANKAUER SUCT BULB TIP NO VENT (SUCTIONS) ×4 IMPLANT
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
## 2018-06-14 NOTE — Progress Notes (Signed)
Orthopedic Tech Progress Note Patient Details:  Noah Duke [birth year] [id number]  Ortho Devices Ortho Device/Splint Location: Level 1 Trauma Ortho Device/Splint Interventions: Application   Post Interventions Patient Tolerated: Well Instructions Provided: Care of device   [name] 06/14/2018, 5:25 PM
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
## 2018-06-14 NOTE — Anesthesia Preprocedure Evaluation (Addendum)
Anesthesia Evaluation  Patient identified by MRN, date of birth, ID band Patient unresponsive    Reviewed: Allergy & Precautions, Patient's Chart, lab work & pertinent test results, Unable to perform ROS - Chart review onlyPreop documentation limited or incomplete due to emergent nature of procedure.  Airway Mallampati: Intubated       Dental  (+) Teeth Intact   Pulmonary     + decreased breath sounds      Cardiovascular  Rhythm:Regular Rate:Tachycardia     Neuro/Psych negative psych ROS   GI/Hepatic   Endo/Other    Renal/GU      Musculoskeletal   Abdominal Normal abdominal exam  (+)   Peds  Hematology   Anesthesia Other Findings   Reproductive/Obstetrics                            Lab Results  Component Value Date   WBC 9.7 06/14/2018   HGB 17.0 06/14/2018   HCT 50.0 06/14/2018   MCV 98.2 06/14/2018   PLT 337 06/14/2018   Lab Results  Component Value Date   CREATININE 1.40 (H) 06/14/2018   BUN 12 06/14/2018   NA 140 06/14/2018   K 4.0 06/14/2018   CL 104 06/14/2018   CO2 17 (L) 06/14/2018   Lab Results  Component Value Date   INR 0.99 06/14/2018     Anesthesia Physical Anesthesia Plan  ASA: II and emergent  Anesthesia Plan: General   Post-op Pain Management:    Induction: Inhalational  PONV Risk Score and Plan: 3 and Ondansetron, Treatment may vary due to age or medical condition and Midazolam  Airway Management Planned: Oral ETT  Additional Equipment: Arterial line  Intra-op Plan:   Post-operative Plan: Post-operative intubation/ventilation  Informed Consent: I have reviewed the patients History and Physical, chart, labs and discussed the procedure including the risks, benefits and alternatives for the proposed anesthesia with the patient or authorized representative who has indicated his/her understanding and acceptance.     Plan Discussed with:  CRNA  Anesthesia Plan Comments:        Anesthesia Quick Evaluation
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
## 2018-06-14 NOTE — ED Notes (Signed)
Dr. Janee Morn reports a left elbow laceration, left flank abrasion, and negative stepoff on the back.
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
## 2018-06-14 NOTE — Transfer of Care (Signed)
Immediate Anesthesia Transfer of Care Note  Patient: Noah Duke  Procedure(s) Performed: IRRIGATION AND DEBRIDEMENT TIB FIB EX FIX AND WOUND VAC APPLICATION (Right Leg Lower) LOWER EXTREMITY KNEE SPANNING EX FIX (Left Leg Lower)  Patient Location: ICU  Anesthesia Type:General  Level of Consciousness: sedated and Patient remains intubated per anesthesia plan  Airway & Oxygen Therapy: Patient remains intubated per anesthesia plan and Patient placed on Ventilator (see vital sign flow sheet for setting)  Post-op Assessment: Report given to RN and Post -op Vital signs reviewed and stable  Post vital signs: Reviewed and stable  Last Vitals:  Vitals Value Taken Time  BP 129/80 06/14/2018 10:15 PM  Temp 37.1 C 06/14/2018 10:25 PM  Pulse 79 06/14/2018 10:23 PM  Resp 19 06/14/2018 10:25 PM  SpO2 100 % 06/14/2018 10:23 PM  Vitals shown include unvalidated device data.  Last Pain:  Vitals:   06/14/18 1640  TempSrc: Temporal  PainSc: 10-Worst pain ever         Complications: No apparent anesthesia complications
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
## 2018-06-15 ENCOUNTER — Inpatient Hospital Stay (HOSPITAL_COMMUNITY): Payer: No Typology Code available for payment source

## 2018-06-15 LAB — CBC
HEMATOCRIT: 43.4 % (ref 39.0–52.0)
HEMATOCRIT: 40.2 % (ref 39.0–52.0)
HEMATOCRIT: 32.4 % — AB (ref 39.0–52.0)
HEMOGLOBIN: 13.9 g/dL (ref 13.0–17.0)
HEMOGLOBIN: 12.7 g/dL — AB (ref 13.0–17.0)
HEMOGLOBIN: 10.4 g/dL — AB (ref 13.0–17.0)
MCH: 32.2 pg (ref 26.0–34.0)
MCH: 32.2 pg (ref 26.0–34.0)
MCH: 32.5 pg (ref 26.0–34.0)
MCHC: 32 g/dL (ref 30.0–36.0)
MCHC: 31.6 g/dL (ref 30.0–36.0)
MCHC: 32.1 g/dL (ref 30.0–36.0)
MCV: 100.5 fL — AB (ref 78.0–100.0)
MCV: 102 fL — AB (ref 78.0–100.0)
MCV: 101.3 fL — ABNORMAL HIGH (ref 78.0–100.0)
Platelets: 201 10*3/uL (ref 150–400)
Platelets: 168 10*3/uL (ref 150–400)
Platelets: 147 10*3/uL — ABNORMAL LOW (ref 150–400)
RBC: 4.32 MIL/uL (ref 4.22–5.81)
RBC: 3.94 MIL/uL — AB (ref 4.22–5.81)
RBC: 3.2 MIL/uL — ABNORMAL LOW (ref 4.22–5.81)
RDW: 14.2 % (ref 11.5–15.5)
RDW: 14.2 % (ref 11.5–15.5)
RDW: 13.9 % (ref 11.5–15.5)
WBC: 6.4 10*3/uL (ref 4.0–10.5)
WBC: 7.1 10*3/uL (ref 4.0–10.5)
WBC: 6 10*3/uL (ref 4.0–10.5)

## 2018-06-15 LAB — PROTIME-INR
INR: 1.14
Prothrombin Time: 14.5 seconds (ref 11.4–15.2)

## 2018-06-15 LAB — BPAM FFP
Blood Product Expiration Date: 201908182359
Blood Product Expiration Date: 201908182359
ISSUE DATE / TIME: 201908161641
ISSUE DATE / TIME: 201908161641
UNIT TYPE AND RH: 6200
Unit Type and Rh: 6200

## 2018-06-15 LAB — BASIC METABOLIC PANEL
ANION GAP: 13 (ref 5–15)
ANION GAP: 8 (ref 5–15)
BUN: 11 mg/dL (ref 6–20)
BUN: 10 mg/dL (ref 6–20)
CALCIUM: 7.3 mg/dL — AB (ref 8.9–10.3)
CHLORIDE: 104 mmol/L (ref 98–111)
CO2: 26 mmol/L (ref 22–32)
CO2: 26 mmol/L (ref 22–32)
Calcium: 8.3 mg/dL — ABNORMAL LOW (ref 8.9–10.3)
Chloride: 104 mmol/L (ref 98–111)
Creatinine, Ser: 1.27 mg/dL — ABNORMAL HIGH (ref 0.61–1.24)
Creatinine, Ser: 1.42 mg/dL — ABNORMAL HIGH (ref 0.61–1.24)
GFR calc Af Amer: >60 mL/min (ref >60)
GFR calc non Af Amer: >60 mL/min (ref >60)
GLUCOSE: 120 mg/dL — AB (ref 70–99)
Glucose, Bld: 84 mg/dL (ref 70–99)
POTASSIUM: 4 mmol/L (ref 3.5–5.1)
POTASSIUM: 4.4 mmol/L (ref 3.5–5.1)
SODIUM: 143 mmol/L (ref 135–145)
Sodium: 138 mmol/L (ref 135–145)

## 2018-06-15 LAB — TYPE AND SCREEN
ABO/RH(D): A POS
ANTIBODY SCREEN: NEGATIVE
UNIT DIVISION: 0
Unit division: 0

## 2018-06-15 LAB — PREPARE FRESH FROZEN PLASMA

## 2018-06-15 LAB — GLUCOSE, CAPILLARY
GLUCOSE-CAPILLARY: 113 mg/dL — AB (ref 70–99)
GLUCOSE-CAPILLARY: 130 mg/dL — AB (ref 70–99)
GLUCOSE-CAPILLARY: 82 mg/dL (ref 70–99)
Glucose-Capillary: 120 mg/dL — ABNORMAL HIGH (ref 70–99)
Glucose-Capillary: 117 mg/dL — ABNORMAL HIGH (ref 70–99)

## 2018-06-15 LAB — PHOSPHORUS
PHOSPHORUS: 4.9 mg/dL — AB (ref 2.5–4.6)
Phosphorus: 3.4 mg/dL (ref 2.5–4.6)

## 2018-06-15 LAB — MAGNESIUM
MAGNESIUM: 1.5 mg/dL — AB (ref 1.7–2.4)
MAGNESIUM: 1.5 mg/dL — AB (ref 1.7–2.4)

## 2018-06-15 LAB — POCT I-STAT 3, ART BLOOD GAS (G3+)
Acid-Base Excess: 1 mmol/L (ref 0.0–2.0)
Acid-base deficit: 1 mmol/L (ref 0.0–2.0)
Bicarbonate: 27.4 mmol/L (ref 20.0–28.0)
Bicarbonate: 28.3 mmol/L — ABNORMAL HIGH (ref 20.0–28.0)
O2 SAT: 99 %
O2 Saturation: 100 %
PCO2 ART: 61.1 mmHg — AB (ref 32.0–48.0)
Patient temperature: 37.7
TCO2: 29 mmol/L (ref 22–32)
TCO2: 30 mmol/L (ref 22–32)
pCO2 arterial: 61.4 mmHg — ABNORMAL HIGH (ref 32.0–48.0)
pH, Arterial: 7.262 — ABNORMAL LOW (ref 7.350–7.450)
pH, Arterial: 7.281 — ABNORMAL LOW (ref 7.350–7.450)
pO2, Arterial: 186 mmHg — ABNORMAL HIGH (ref 83.0–108.0)
pO2, Arterial: 662 mmHg — ABNORMAL HIGH (ref 83.0–108.0)

## 2018-06-15 LAB — BPAM RBC
Blood Product Expiration Date: 201909132359
Blood Product Expiration Date: 201909132359
ISSUE DATE / TIME: 201908161640
ISSUE DATE / TIME: 201908161640
UNIT TYPE AND RH: 9500
Unit Type and Rh: 9500

## 2018-06-15 LAB — MRSA PCR SCREENING: MRSA BY PCR: NEGATIVE

## 2018-06-15 LAB — HIV ANTIBODY (ROUTINE TESTING W REFLEX): HIV SCREEN 4TH GENERATION: NONREACTIVE

## 2018-06-15 LAB — TRIGLYCERIDES: TRIGLYCERIDES: 152 mg/dL — AB (ref <150)

## 2018-06-15 LAB — CDS SEROLOGY

## 2018-06-15 LAB — LACTIC ACID, PLASMA: LACTIC ACID, VENOUS: 2.5 mmol/L — AB (ref 0.5–1.9)

## 2018-06-15 MED ORDER — VITAMIN E 100 UNT/0.25ML PO OIL
400.0000 [IU] | TOPICAL_OIL | Freq: Three times a day (TID) | ORAL | Status: DC
  Administered 2018-06-15 – 2018-06-18 (×9): 400 [IU]
  Filled 2018-06-15 (×13): qty 1

## 2018-06-15 MED ORDER — VITAL HIGH PROTEIN PO LIQD
1000.0000 mL | ORAL | Status: DC
  Administered 2018-06-15: 1000 mL

## 2018-06-15 MED ORDER — PRO-STAT SUGAR FREE PO LIQD
30.0000 mL | Freq: Two times a day (BID) | ORAL | Status: DC
Start: 2018-06-15 — End: 2018-06-18
  Administered 2018-06-15 – 2018-06-18 (×7): 30 mL
  Filled 2018-06-15 (×5): qty 30

## 2018-06-15 MED ORDER — VITAMIN C 500 MG PO TABS
1000.0000 mg | ORAL_TABLET | Freq: Three times a day (TID) | ORAL | Status: DC
  Administered 2018-06-15 – 2018-06-18 (×9): 1000 mg
  Filled 2018-06-15 (×10): qty 2

## 2018-06-15 MED ORDER — SELENIUM 200 MCG PO TABS
200.0000 ug | ORAL_TABLET | Freq: Every day | ORAL | Status: AC
  Administered 2018-06-15 – 2018-06-19 (×4): 200 ug
  Filled 2018-06-15 (×7): qty 1

## 2018-06-15 MED ORDER — ALBUMIN HUMAN 5 % IV SOLN
25.0000 g | Freq: Once | INTRAVENOUS | Status: AC
Start: 2018-06-15 — End: 2018-06-15
  Administered 2018-06-15: 25 g via INTRAVENOUS
  Filled 2018-06-15: qty 500

## 2018-06-15 MED ORDER — PIVOT 1.5 CAL PO LIQD
1000.0000 mL | ORAL | Status: DC
  Administered 2018-06-15 – 2018-06-18 (×3): 1000 mL

## 2018-06-15 MED ORDER — ACETAMINOPHEN 160 MG/5ML PO SOLN
650.0000 mg | Freq: Four times a day (QID) | ORAL | Status: DC | PRN
  Administered 2018-06-15 – 2018-06-18 (×9): 650 mg
  Filled 2018-06-15 (×9): qty 20.3

## 2018-06-15 NOTE — ED Provider Notes (Signed)
Procedure Name: Intubation Date/Time: 06/15/2018 12:43 AM Performed by: Alford Highland, MD Pre-anesthesia Checklist: Patient identified, Emergency Drugs available, Suction available and Patient being monitored Oxygen Delivery Method: Non-rebreather mask Preoxygenation: Pre-oxygenation with 100% oxygen Induction Type: Rapid sequence Laryngoscope Size: Mac and 3 Grade View: Grade I Tube size: 7.5 mm Number of attempts: 1 Intubation method: Direct laryngoscopy. Placement Confirmation: ETT inserted through vocal cords under direct vision,  CO2 detector and Breath sounds checked- equal and bilateral Dental Injury: Teeth and Oropharynx as per pre-operative assessment          Alford Highland, MD 06/15/18 1751    Carmin Muskrat, MD 06/16/18 [phone]

## 2018-06-15 NOTE — Progress Notes (Signed)
CRITICAL VALUE STICKER  CRITICAL VALUE: Lactic Acid 2.5  RECEIVER (on-site recipient of call): P. Usiel Astarita, RN   MD NOTIFIED: Trauma  TIME OF NOTIFICATION: 06/15/18 1244  RESPONSE: 25g Albumin ordered
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
## 2018-06-15 NOTE — Anesthesia Postprocedure Evaluation (Signed)
Anesthesia Post Note  Patient: Noah Duke  Procedure(s) Performed: IRRIGATION AND DEBRIDEMENT TIB FIB EX FIX AND WOUND VAC APPLICATION (Right Leg Lower) LOWER EXTREMITY KNEE SPANNING EX FIX (Left Leg Lower)     Patient location during evaluation: SICU Anesthesia Type: General Level of consciousness: sedated Pain management: pain level controlled Vital Signs Assessment: post-procedure vital signs reviewed and stable Respiratory status: patient remains intubated per anesthesia plan Cardiovascular status: stable Postop Assessment: no apparent nausea or vomiting Anesthetic complications: no    Last Vitals:  Vitals:   06/14/18 2212 06/14/18 2216  BP:  129/80  Pulse:  84  Resp:  16  Temp:    SpO2: 100% 100%                  Shelton Silvas
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
## 2018-06-15 NOTE — Progress Notes (Signed)
Subjective: 1 Day Post-Op Procedure(s) (LRB): IRRIGATION AND DEBRIDEMENT TIB FIB EX FIX AND WOUND VAC APPLICATION (Right) LOWER EXTREMITY KNEE SPANNING EX FIX (Left) Patient reports pain as intubated.    Objective: Vital signs in last 24 hours: Temp:  [98.7 F (37.1 C)-102 F (38.9 C)] 101.5 F (38.6 C) (08/17 1100) Pulse Rate:  [81-135] 104 (08/17 1116) Resp:  [0-42] 22 (08/17 1116) BP: (98-199)/(55-144) 98/56 (08/17 1116) SpO2:  [83 %-100 %] 100 % (08/17 1116) FiO2 (%):  [40 %-100 %] 40 % (08/17 1116) Weight:  [89.4 kg-90 kg] 89.4 kg (08/16 2215)  Intake/Output from previous day: 08/16 0701 - 08/17 0700 In: 8021 [I.V.:7711; IV Piggyback:310.1] Out: 1985 [Urine:1035; Emesis/NG output:500; Drains:400; Blood:50] Intake/Output this shift: Total I/O In: 505.2 [I.V.:505.2] Out: 175 [Urine:125; Drains:50]  Recent Labs    06/14/18 1647 06/14/18 1657 06/14/18 2324 06/15/18 0915  HGB 15.9 17.0 13.9 12.7*   Recent Labs    06/14/18 2324 06/15/18 0915  WBC 6.4 7.1  RBC 4.32 3.94*  HCT 43.4 40.2  PLT 201 168   Recent Labs    06/14/18 2324 06/15/18 0915  NA 143 138  K 4.0 4.4  CL 104 104  CO2 26 26  BUN 11 10  CREATININE 1.27* 1.42*  GLUCOSE 84 120*  CALCIUM 8.3* 7.3*   Recent Labs    06/14/18 1647 06/15/18 0915  INR 0.99 1.14    cap refill less than 2 seconds    Assessment/Plan: 1 Day Post-Op Procedure(s) (LRB): IRRIGATION AND DEBRIDEMENT TIB FIB EX FIX AND WOUND VAC APPLICATION (Right) LOWER EXTREMITY KNEE SPANNING EX FIX (Left) Plan for stabilization of patient and then will require fracture fixation once cleared by trauma    Jacqualine Code 06/15/2018, 11:28 AM

## 2018-06-15 NOTE — Progress Notes (Signed)
RT assisted Scientist, water quality with transporting pt fron 4N to CT and back without any complications.

## 2018-06-16 ENCOUNTER — Inpatient Hospital Stay (HOSPITAL_COMMUNITY): Payer: No Typology Code available for payment source

## 2018-06-16 LAB — POCT I-STAT 3, ART BLOOD GAS (G3+)
Acid-Base Excess: 3 mmol/L — ABNORMAL HIGH (ref 0.0–2.0)
Bicarbonate: 28.6 mmol/L — ABNORMAL HIGH (ref 20.0–28.0)
O2 Saturation: 93 %
PCO2 ART: 52.3 mmHg — AB (ref 32.0–48.0)
TCO2: 30 mmol/L (ref 22–32)
pH, Arterial: 7.351 (ref 7.350–7.450)
pO2, Arterial: 74 mmHg — ABNORMAL LOW (ref 83.0–108.0)

## 2018-06-16 LAB — GLUCOSE, CAPILLARY
GLUCOSE-CAPILLARY: 131 mg/dL — AB (ref 70–99)
GLUCOSE-CAPILLARY: 121 mg/dL — AB (ref 70–99)
GLUCOSE-CAPILLARY: 99 mg/dL (ref 70–99)
GLUCOSE-CAPILLARY: 108 mg/dL — AB (ref 70–99)
GLUCOSE-CAPILLARY: 115 mg/dL — AB (ref 70–99)

## 2018-06-16 LAB — BASIC METABOLIC PANEL
ANION GAP: 6 (ref 5–15)
BUN: 11 mg/dL (ref 6–20)
CALCIUM: 7.5 mg/dL — AB (ref 8.9–10.3)
CO2: 27 mmol/L (ref 22–32)
Chloride: 105 mmol/L (ref 98–111)
Creatinine, Ser: 1.18 mg/dL (ref 0.61–1.24)
GFR calc Af Amer: >60 mL/min (ref >60)
GLUCOSE: 132 mg/dL — AB (ref 70–99)
POTASSIUM: 3.5 mmol/L (ref 3.5–5.1)
Sodium: 138 mmol/L (ref 135–145)

## 2018-06-16 LAB — CBC
HEMATOCRIT: 25.6 % — AB (ref 39.0–52.0)
HEMOGLOBIN: 9.1 g/dL — AB (ref 13.0–17.0)
MCH: 36.7 pg — AB (ref 26.0–34.0)
MCHC: 35.5 g/dL (ref 30.0–36.0)
MCV: 103.2 fL — ABNORMAL HIGH (ref 78.0–100.0)
PLATELETS: 137 10*3/uL — AB (ref 150–400)
RBC: 2.48 MIL/uL — AB (ref 4.22–5.81)
RDW: 13.5 % (ref 11.5–15.5)
WBC: 6.9 10*3/uL (ref 4.0–10.5)

## 2018-06-16 LAB — PHOSPHORUS
Phosphorus: 2.2 mg/dL — ABNORMAL LOW (ref 2.5–4.6)
Phosphorus: 2 mg/dL — ABNORMAL LOW (ref 2.5–4.6)

## 2018-06-16 LAB — MAGNESIUM
MAGNESIUM: 1.8 mg/dL (ref 1.7–2.4)
Magnesium: 1.9 mg/dL (ref 1.7–2.4)

## 2018-06-16 MED ORDER — CHLORHEXIDINE GLUCONATE 0.12 % MT SOLN
OROMUCOSAL | Status: AC
  Administered 2018-06-16: 15 mL
  Filled 2018-06-16: qty 15

## 2018-06-16 NOTE — Progress Notes (Signed)
Subjective: 2 Days Post-Op Procedure(s) (LRB): IRRIGATION AND DEBRIDEMENT TIB FIB EX FIX AND WOUND VAC APPLICATION (Right) LOWER EXTREMITY KNEE SPANNING EX FIX (Left) Patient reports pain as unknown....intubated but awake.    Objective: Vital signs in last 24 hours: Temp:  [100.6 F (38.1 C)-102.6 F (39.2 C)] 100.6 F (38.1 C) (08/18 1000) Pulse Rate:  [81-120] 81 (08/18 1000) Resp:  [12-23] 22 (08/18 1000) BP: (93-134)/(50-78) 117/62 (08/18 1000) SpO2:  [100 %] 100 % (08/18 1000) FiO2 (%):  [40 %] 40 % (08/18 0758) Weight:  [87.3 kg-93.5 kg] 93.5 kg (08/18 0423)  Intake/Output from previous day: 08/17 0701 - 08/18 0700 In: 4213.4 [I.V.:2344.8; NG/GT:1192; IV Piggyback:676.7] Out: 1460 [Urine:1200; Drains:260] Intake/Output this shift: Total I/O In: 383.3 [I.V.:273.6; NG/GT:100; IV Piggyback:9.7] Out: 60 [Urine:60]  Recent Labs    06/14/18 1657 06/14/18 2324 06/15/18 0915 06/15/18 1647 06/16/18 0528  HGB 17.0 13.9 12.7* 10.4* 9.1*   Recent Labs    06/15/18 1647 06/16/18 0528  WBC 6.0 6.9  RBC 3.20* 2.48*  HCT 32.4* 25.6*  PLT 147* 137*   Recent Labs    06/15/18 0915 06/16/18 0812  NA 138 138  K 4.4 3.5  CL 104 105  CO2 26 27  BUN 10 11  CREATININE 1.42* 1.18  GLUCOSE 120* 132*  CALCIUM 7.3* 7.5*   Recent Labs    06/14/18 1647 06/15/18 0915  INR 0.99 1.14    Moving toes and good cap refill    Assessment/Plan: 2 Days Post-Op Procedure(s) (LRB): IRRIGATION AND DEBRIDEMENT TIB FIB EX FIX AND WOUND VAC APPLICATION (Right) LOWER EXTREMITY KNEE SPANNING EX FIX (Left) Will require ORIF once stable    Jacqualine Code 06/16/2018, 10:23 AM
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
## 2018-06-17 ENCOUNTER — Encounter (HOSPITAL_COMMUNITY): Admission: EM | Disposition: A | Discharge status: Home Health | Payer: Self-pay | Source: Home / Self Care

## 2018-06-17 ENCOUNTER — Inpatient Hospital Stay (HOSPITAL_COMMUNITY): Payer: No Typology Code available for payment source | Admitting: Anesthesiology

## 2018-06-17 ENCOUNTER — Inpatient Hospital Stay (HOSPITAL_COMMUNITY): Payer: No Typology Code available for payment source

## 2018-06-17 ENCOUNTER — Encounter (HOSPITAL_COMMUNITY): Payer: Self-pay | Admitting: Anesthesiology

## 2018-06-17 HISTORY — PX: TIBIA IM NAIL INSERTION: SHX2516

## 2018-06-17 HISTORY — PX: FEMUR IM NAIL: SHX1597

## 2018-06-17 HISTORY — PX: ORIF TIBIA PLATEAU: SHX2132

## 2018-06-17 HISTORY — PX: I & D EXTREMITY: SHX5045

## 2018-06-17 LAB — CBC
HEMATOCRIT: 25.6 % — AB (ref 39.0–52.0)
Hemoglobin: 8 g/dL — ABNORMAL LOW (ref 13.0–17.0)
MCH: 31.7 pg (ref 26.0–34.0)
MCHC: 31.3 g/dL (ref 30.0–36.0)
MCV: 101.6 fL — AB (ref 78.0–100.0)
Platelets: 130 10*3/uL — ABNORMAL LOW (ref 150–400)
RBC: 2.52 MIL/uL — AB (ref 4.22–5.81)
RDW: 13.2 % (ref 11.5–15.5)
WBC: 5.5 10*3/uL (ref 4.0–10.5)

## 2018-06-17 LAB — BASIC METABOLIC PANEL
ANION GAP: 7 (ref 5–15)
BUN: 8 mg/dL (ref 6–20)
CO2: 26 mmol/L (ref 22–32)
Calcium: 7.8 mg/dL — ABNORMAL LOW (ref 8.9–10.3)
Chloride: 104 mmol/L (ref 98–111)
Creatinine, Ser: 0.95 mg/dL (ref 0.61–1.24)
GFR calc non Af Amer: >60 mL/min (ref >60)
Glucose, Bld: 132 mg/dL — ABNORMAL HIGH (ref 70–99)
POTASSIUM: 3.4 mmol/L — AB (ref 3.5–5.1)
Sodium: 137 mmol/L (ref 135–145)

## 2018-06-17 LAB — POCT I-STAT 3, ART BLOOD GAS (G3+)
Acid-base deficit: 1 mmol/L (ref 0.0–2.0)
Bicarbonate: 24.9 mmol/L (ref 20.0–28.0)
O2 Saturation: 98 %
TCO2: 26 mmol/L (ref 22–32)
pCO2 arterial: 47.6 mmHg (ref 32.0–48.0)
pH, Arterial: 7.333 — ABNORMAL LOW (ref 7.350–7.450)
pO2, Arterial: 126 mmHg — ABNORMAL HIGH (ref 83.0–108.0)

## 2018-06-17 LAB — GLUCOSE, CAPILLARY
GLUCOSE-CAPILLARY: 100 mg/dL — AB (ref 70–99)
GLUCOSE-CAPILLARY: 111 mg/dL — AB (ref 70–99)
GLUCOSE-CAPILLARY: 97 mg/dL (ref 70–99)
Glucose-Capillary: 112 mg/dL — ABNORMAL HIGH (ref 70–99)
Glucose-Capillary: 97 mg/dL (ref 70–99)

## 2018-06-17 LAB — TRIGLYCERIDES: Triglycerides: 111 mg/dL (ref <150)

## 2018-06-17 SURGERY — INSERTION, INTRAMEDULLARY ROD, TIBIA
Anesthesia: General | Site: Leg Upper | Laterality: Right

## 2018-06-17 MED ORDER — LACTATED RINGERS IV SOLN
INTRAVENOUS | Status: DC | PRN
  Administered 2018-06-17 (×2): via INTRAVENOUS

## 2018-06-17 MED ORDER — POTASSIUM CHLORIDE 10 MEQ/100ML IV SOLN
10.0000 meq | INTRAVENOUS | Status: AC
  Administered 2018-06-17 (×2): 10 meq via INTRAVENOUS

## 2018-06-17 MED ORDER — TOBRAMYCIN SULFATE 1.2 G IJ SOLR
INTRAMUSCULAR | Status: DC | PRN
  Administered 2018-06-17: 1.2 g via TOPICAL

## 2018-06-17 MED ORDER — POTASSIUM CHLORIDE 10 MEQ/100ML IV SOLN
INTRAVENOUS | Status: AC
  Filled 2018-06-17: qty 100

## 2018-06-17 MED ORDER — ROCURONIUM BROMIDE 50 MG/5ML IV SOSY
PREFILLED_SYRINGE | INTRAVENOUS | Status: DC | PRN
  Administered 2018-06-17 (×5): 50 mg via INTRAVENOUS

## 2018-06-17 MED ORDER — TOBRAMYCIN SULFATE 1.2 G IJ SOLR
INTRAMUSCULAR | Status: AC
  Filled 2018-06-17: qty 1.2

## 2018-06-17 MED ORDER — PROPOFOL 500 MG/50ML IV EMUL
INTRAVENOUS | Status: DC | PRN
  Administered 2018-06-17: 40 ug/kg/min via INTRAVENOUS

## 2018-06-17 MED ORDER — CEFAZOLIN SODIUM 1 G IJ SOLR
INTRAMUSCULAR | Status: AC
  Filled 2018-06-17: qty 10

## 2018-06-17 MED ORDER — SUGAMMADEX SODIUM 200 MG/2ML IV SOLN
INTRAVENOUS | Status: DC | PRN
  Administered 2018-06-17: 200 mg via INTRAVENOUS

## 2018-06-17 MED ORDER — SODIUM CHLORIDE 0.9 % IR SOLN
Status: DC | PRN
  Administered 2018-06-17 (×2): 3000 mL

## 2018-06-17 MED ORDER — MIDAZOLAM HCL 2 MG/2ML IJ SOLN
INTRAMUSCULAR | Status: AC
  Filled 2018-06-17: qty 2

## 2018-06-17 MED ORDER — FENTANYL CITRATE (PF) 100 MCG/2ML IJ SOLN
INTRAMUSCULAR | Status: DC | PRN
  Administered 2018-06-17 (×5): 50 ug via INTRAVENOUS

## 2018-06-17 MED ORDER — ROCURONIUM BROMIDE 50 MG/5ML IV SOSY
PREFILLED_SYRINGE | INTRAVENOUS | Status: AC
  Filled 2018-06-17: qty 5

## 2018-06-17 MED ORDER — SODIUM CHLORIDE 0.9 % IJ SOLN
INTRAMUSCULAR | Status: AC
  Filled 2018-06-17: qty 10

## 2018-06-17 MED ORDER — PROPOFOL 1000 MG/100ML IV EMUL
INTRAVENOUS | Status: AC
  Filled 2018-06-17: qty 100

## 2018-06-17 MED ORDER — SODIUM CHLORIDE 0.9 % IV SOLN
INTRAVENOUS | Status: DC | PRN
  Administered 2018-06-17: 10 ug/min via INTRAVENOUS

## 2018-06-17 MED ORDER — VANCOMYCIN HCL 1000 MG IV SOLR
INTRAVENOUS | Status: AC
  Filled 2018-06-17: qty 1000

## 2018-06-17 MED ORDER — MIDAZOLAM HCL 5 MG/5ML IJ SOLN
INTRAMUSCULAR | Status: DC | PRN
  Administered 2018-06-17 (×2): 2 mg via INTRAVENOUS

## 2018-06-17 MED ORDER — ALBUMIN HUMAN 5 % IV SOLN
INTRAVENOUS | Status: DC | PRN
  Administered 2018-06-17: 15:00:00 via INTRAVENOUS

## 2018-06-17 MED ORDER — FENTANYL CITRATE (PF) 250 MCG/5ML IJ SOLN
INTRAMUSCULAR | Status: AC
  Filled 2018-06-17: qty 5

## 2018-06-17 MED ORDER — 0.9 % SODIUM CHLORIDE (POUR BTL) OPTIME
TOPICAL | Status: DC | PRN
  Administered 2018-06-17 (×2): 1000 mL

## 2018-06-17 MED ORDER — VANCOMYCIN HCL 1000 MG IV SOLR
INTRAVENOUS | Status: DC | PRN
  Administered 2018-06-17: 1000 mg via TOPICAL

## 2018-06-17 MED ORDER — POTASSIUM CHLORIDE 10 MEQ/100ML IV SOLN: 10.0000 meq | INTRAVENOUS | Status: AC

## 2018-06-17 MED ORDER — ROCURONIUM BROMIDE 50 MG/5ML IV SOSY
PREFILLED_SYRINGE | INTRAVENOUS | Status: AC
  Filled 2018-06-17: qty 10

## 2018-06-17 SURGICAL SUPPLY — 136 items
BANDAGE ACE 4X5 VEL STRL LF (GAUZE/BANDAGES/DRESSINGS) ×7 IMPLANT
BANDAGE ACE 6X5 VEL STRL LF (GAUZE/BANDAGES/DRESSINGS) ×9 IMPLANT
BANDAGE ESMARK 6X9 LF (GAUZE/BANDAGES/DRESSINGS) ×3 IMPLANT
BIT DRILL 1.5X30 QC DISP (BIT) ×2 IMPLANT
BIT DRILL 2.5X2.75 QC CALB (BIT) ×2 IMPLANT
BIT DRILL 3.3 LONG (BIT) ×2 IMPLANT
BIT DRILL 3.5X5.5 QC CALB (BIT) ×4 IMPLANT
BIT DRILL 3.8X6 NS (BIT) ×2 IMPLANT
BIT DRILL 4.4 NS (BIT) ×2 IMPLANT
BIT DRILL CALIBRATED 4.3MMX365 (DRILL) IMPLANT
BIT DRILL CROWE PNT TWST 4.5MM (DRILL) IMPLANT
BIT DRILL QC 3.3X195 (BIT) ×2 IMPLANT
BLADE CLIPPER SURG (BLADE) ×2 IMPLANT
BLADE SURG 10 STRL SS (BLADE) ×3 IMPLANT
BLADE SURG 15 STRL LF DISP TIS (BLADE) ×3 IMPLANT
BLADE SURG 15 STRL SS (BLADE) ×5
BNDG CMPR 9X6 STRL LF SNTH (GAUZE/BANDAGES/DRESSINGS)
BNDG COHESIVE 4X5 TAN STRL (GAUZE/BANDAGES/DRESSINGS) ×3 IMPLANT
BNDG ESMARK 6X9 LF (GAUZE/BANDAGES/DRESSINGS)
BNDG GAUZE ELAST 4 BULKY (GAUZE/BANDAGES/DRESSINGS) ×10 IMPLANT
BNDG GAUZE STRTCH 6 (GAUZE/BANDAGES/DRESSINGS) ×15 IMPLANT
BRUSH SCRUB SURG 4.25 DISP (MISCELLANEOUS) ×10 IMPLANT
CANISTER SUCT 3000ML PPV (MISCELLANEOUS) ×3 IMPLANT
CAP LOCK NCB (Cap) ×8 IMPLANT
COVER MAYO STAND STRL (DRAPES) ×5 IMPLANT
COVER SURGICAL LIGHT HANDLE (MISCELLANEOUS) ×8 IMPLANT
CUFF TOURNIQUET SINGLE 34IN LL (TOURNIQUET CUFF) ×3 IMPLANT
DRAPE C-ARM 42X72 X-RAY (DRAPES) ×5 IMPLANT
DRAPE C-ARMOR (DRAPES) ×5 IMPLANT
DRAPE HALF SHEET 40X57 (DRAPES) ×2 IMPLANT
DRAPE IMP U-DRAPE 54X76 (DRAPES) ×5 IMPLANT
DRAPE INCISE IOBAN 66X45 STRL (DRAPES) ×3 IMPLANT
DRAPE ORTHO SPLIT 77X108 STRL (DRAPES) ×10
DRAPE SURG ORHT 6 SPLT 77X108 (DRAPES) ×6 IMPLANT
DRAPE U-SHAPE 47X51 STRL (DRAPES) ×5 IMPLANT
DRILL CALIBRATED 4.3MMX365 (DRILL) ×5
DRILL CROWE POINT TWIST 4.5MM (DRILL) ×5
DRSG ADAPTIC 3X8 NADH LF (GAUZE/BANDAGES/DRESSINGS) ×11 IMPLANT
DRSG MEPITEL 4X7.2 (GAUZE/BANDAGES/DRESSINGS) ×3 IMPLANT
DRSG PAD ABDOMINAL 8X10 ST (GAUZE/BANDAGES/DRESSINGS) ×10 IMPLANT
DRSG VAC ATS LRG SENSATRAC (GAUZE/BANDAGES/DRESSINGS) ×2 IMPLANT
DRSG VAC ATS SM SENSATRAC (GAUZE/BANDAGES/DRESSINGS) ×2 IMPLANT
DRSG VERSA FOAM LRG 10X15 (GAUZE/BANDAGES/DRESSINGS) ×2 IMPLANT
ELECT REM PT RETURN 9FT ADLT (ELECTROSURGICAL) ×5
ELECTRODE REM PT RTRN 9FT ADLT (ELECTROSURGICAL) ×3 IMPLANT
EVACUATOR 1/8 PVC DRAIN (DRAIN) IMPLANT
GAUZE SPONGE 4X4 12PLY STRL (GAUZE/BANDAGES/DRESSINGS) ×7 IMPLANT
GAUZE SPONGE 4X4 12PLY STRL LF (GAUZE/BANDAGES/DRESSINGS) ×4 IMPLANT
GAUZE XEROFORM 1X8 LF (GAUZE/BANDAGES/DRESSINGS) ×3 IMPLANT
GLOVE BIO SURGEON STRL SZ7.5 (GLOVE) ×5 IMPLANT
GLOVE BIO SURGEON STRL SZ8 (GLOVE) ×5 IMPLANT
GLOVE BIO SURGEON STRL SZ8.5 (GLOVE) ×5 IMPLANT
GLOVE BIOGEL PI IND STRL 7.5 (GLOVE) ×3 IMPLANT
GLOVE BIOGEL PI IND STRL 8 (GLOVE) ×3 IMPLANT
GLOVE BIOGEL PI INDICATOR 7.5 (GLOVE) ×2
GLOVE BIOGEL PI INDICATOR 8 (GLOVE) ×2
GOWN STRL REUS W/ TWL LRG LVL3 (GOWN DISPOSABLE) ×6 IMPLANT
GOWN STRL REUS W/ TWL XL LVL3 (GOWN DISPOSABLE) ×3 IMPLANT
GOWN STRL REUS W/TWL LRG LVL3 (GOWN DISPOSABLE) ×10
GOWN STRL REUS W/TWL XL LVL3 (GOWN DISPOSABLE) ×5
GUIDEPIN 3.2X17.5 THRD DISP (PIN) ×2 IMPLANT
GUIDEWIRE BALL NOSE 80CM (WIRE) ×2 IMPLANT
GUIDEWIRE BEAD TIP (WIRE) ×2 IMPLANT
HANDPIECE INTERPULSE COAX TIP (DISPOSABLE) ×5
IMMOBILIZER KNEE 22 UNIV (SOFTGOODS) ×3 IMPLANT
K-WIRE 2.0 (WIRE) ×10
K-WIRE FXSTD 280X2XNS SS (WIRE) ×6
KIT BASIN OR (CUSTOM PROCEDURE TRAY) ×5 IMPLANT
KIT STIMULAN RAPID CURE  10CC (Orthopedic Implant) ×2 IMPLANT
KIT STIMULAN RAPID CURE 10CC (Orthopedic Implant) IMPLANT
KIT TURNOVER KIT B (KITS) ×5 IMPLANT
KWIRE FXSTD 280X2XNS SS (WIRE) IMPLANT
MANIFOLD NEPTUNE II (INSTRUMENTS) ×5 IMPLANT
NAIL FEM RETRO 9X380 (Orthopedic Implant) ×2 IMPLANT
NAIL TIBIAL 9MMX37.5CM (Nail) ×2 IMPLANT
NDL SUT 6 .5 CRC .975X.05 MAYO (NEEDLE) IMPLANT
NEEDLE MAYO TAPER (NEEDLE)
NS IRRIG 1000ML POUR BTL (IV SOLUTION) ×7 IMPLANT
PACK ORTHO EXTREMITY (CUSTOM PROCEDURE TRAY) ×5 IMPLANT
PACK UNIVERSAL I (CUSTOM PROCEDURE TRAY) ×3 IMPLANT
PAD ABD 8X10 STRL (GAUZE/BANDAGES/DRESSINGS) ×10 IMPLANT
PAD ARMBOARD 7.5X6 YLW CONV (MISCELLANEOUS) ×10 IMPLANT
PAD CAST 4YDX4 CTTN HI CHSV (CAST SUPPLIES) ×3 IMPLANT
PADDING CAST COTTON 4X4 STRL (CAST SUPPLIES) ×10
PADDING CAST COTTON 6X4 STRL (CAST SUPPLIES) ×7 IMPLANT
PLATE LOCK LAT 292 RT 13H (Plate) ×2 IMPLANT
PLATE NCOMP 5H 2.0 STR (Plate) ×2 IMPLANT
SCREW ACECAP 36MM (Screw) ×2 IMPLANT
SCREW ACECAP 46MM (Screw) ×2 IMPLANT
SCREW CORT TI DBL LEAD 5X32 (Screw) ×4 IMPLANT
SCREW CORT TI DBL LEAD 5X58 (Screw) ×2 IMPLANT
SCREW CORT TI DBL LEAD 5X85 (Screw) ×2 IMPLANT
SCREW CORTICAL 2.0X12 (Screw) ×8 IMPLANT
SCREW CORTICAL 3.5MM  46MM (Screw) ×2 IMPLANT
SCREW CORTICAL 3.5MM  60MM (Screw) ×2 IMPLANT
SCREW CORTICAL 3.5MM 46MM (Screw) IMPLANT
SCREW CORTICAL 3.5MM 60MM (Screw) IMPLANT
SCREW NCB 4.0 28MM (Screw) ×6 IMPLANT
SCREW NCB 4.0X26MM (Screw) ×2 IMPLANT
SCREW NCB PA ST 4X3.4X85 (Screw) ×4 IMPLANT
SCREW PROX ST NCB 4X80 (Screw) ×6 IMPLANT
SCREW PROXIMAL DEPUY (Screw) ×10 IMPLANT
SCREW PRXML FT 55X5.5XNS TIB (Screw) IMPLANT
SCREW PRXML FT 65X5.5XNS CORT (Screw) IMPLANT
SET HNDPC FAN SPRY TIP SCT (DISPOSABLE) IMPLANT
SPONGE LAP 18X18 X RAY DECT (DISPOSABLE) ×7 IMPLANT
STAPLER VISISTAT 35W (STAPLE) ×5 IMPLANT
STOCKINETTE IMPERVIOUS 9X36 MD (GAUZE/BANDAGES/DRESSINGS) ×3 IMPLANT
STOCKINETTE IMPERVIOUS LG (DRAPES) ×5 IMPLANT
SUCTION FRAZIER HANDLE 10FR (MISCELLANEOUS)
SUCTION TUBE FRAZIER 10FR DISP (MISCELLANEOUS) ×3 IMPLANT
SUT ETHILON 1 TP 1 60 (SUTURE) ×2 IMPLANT
SUT ETHILON 2 0 FS 18 (SUTURE) ×12 IMPLANT
SUT ETHILON 2 0 PSLX (SUTURE) ×2 IMPLANT
SUT ETHILON 3 0 PS 1 (SUTURE) ×10 IMPLANT
SUT PDS AB 0 CT 36 (SUTURE) ×2 IMPLANT
SUT PDS AB 2-0 CT1 27 (SUTURE) IMPLANT
SUT PROLENE 0 CT 2 (SUTURE) ×10 IMPLANT
SUT PROLENE 3 0 PS 2 (SUTURE) IMPLANT
SUT VIC AB 0 CT1 27 (SUTURE) ×5
SUT VIC AB 0 CT1 27XBRD ANBCTR (SUTURE) ×3 IMPLANT
SUT VIC AB 1 CT1 27 (SUTURE) ×10
SUT VIC AB 1 CT1 27XBRD ANBCTR (SUTURE) ×3 IMPLANT
SUT VIC AB 2-0 CT1 27 (SUTURE) ×10
SUT VIC AB 2-0 CT1 TAPERPNT 27 (SUTURE) ×6 IMPLANT
SUT VIC AB 2-0 CT3 27 (SUTURE) IMPLANT
SWAB CULTURE ESWAB REG 1ML (MISCELLANEOUS) IMPLANT
TOWEL OR 17X24 6PK STRL BLUE (TOWEL DISPOSABLE) ×9 IMPLANT
TOWEL OR 17X26 10 PK STRL BLUE (TOWEL DISPOSABLE) ×10 IMPLANT
TRAY FOLEY MTR SLVR 16FR STAT (SET/KITS/TRAYS/PACK) IMPLANT
TUBE CONNECTING 12'X1/4 (SUCTIONS) ×1
TUBE CONNECTING 12X1/4 (SUCTIONS) ×4 IMPLANT
UNDERPAD 30X30 (UNDERPADS AND DIAPERS) ×7 IMPLANT
WATER STERILE IRR 1000ML POUR (IV SOLUTION) ×6 IMPLANT
WND VAC CANISTER 500ML (MISCELLANEOUS) ×2 IMPLANT
YANKAUER SUCT BULB TIP NO VENT (SUCTIONS) ×5 IMPLANT

## 2018-06-17 NOTE — Brief Op Note (Signed)
06/17/2018  10:10 PM  PATIENT:  Noah Duke  27 y.o. male  PRE-OPERATIVE DIAGNOSES:   1. LEFT COMMINUTED FEMORAL SHAFT FRACTURE 2. COMMINUTED LEFT TIBIAL SHAFT FRACTURE 3. OPEN RIGHT PROXIMAL TIBIA FRACTURE, GRADE 3B 4. RETAINED EXTERNAL FIXATOR OF LEFT FEMUR AND LEFT TIBIA 5. RETAINED EXTERNAL FIXATOR OF RIGHT TIBIA  POST-OPERATIVE DIAGNOSES: 1. LEFT COMMINUTED FEMORAL SHAFT FRACTURE 2. COMMINUTED LEFT TIBIAL SHAFT FRACTURE 3. OPEN RIGHT PROXIMAL TIBIA FRACTURE, GRADE 3B 4. RETAINED EXTERNAL FIXATOR OF LEFT FEMUR AND LEFT TIBIA 5. RETAINED EXTERNAL FIXATOR OF RIGHT TIBIA  PROCEDURES: 1. INTRAMEDULLARY (IM) RETROGRADE FEMORAL NAILING (Left) Biomet Phoneix 9 x statically locked 2. INTRAMEDULLARY (IM) NAIL TIBIAL (Left) Biomet Versanail 9 x statically locked 3. OPEN REDUCTION INTERNAL FIXATION (ORIF) TIBIAL PLATEAU/ SHAFT AND TUBERCLE (Right) with Zimmer NCB 13 hole plate 4. IRRIGATION AND DEBRIDEMENT EXTREMITY (Right) including bone right open tibia 5. REMOVAL OF EXTERNAL FIXATOR LEFT FEMUR 6. REMOVAL OF EXTERNAL FIXATOR LEFT TIBIA 7. REMOVAL OF EXTERNAL FIXATOR RIGHT TIBIA 8. CURETTAGE OF ULCERATED PIN SITES SKIN, SUBCU, AND MUSCLE/ FASCIA LEFT THIGH, LEFT LEG, RIGHT THIGH, RIGHT LEG  9. LEFT LEG ANTERIOR COMPARTMENT FASCIOTOMY 10. Placement of antibiotic beads right tibia 11. Dressing change under anesthesia right leg 12. Stress fluoro of left ankle syndesmosis (oblique proximal fibula fracture)  SURGEON:  Surgeon(s) and Role:    Myrene Galas, MD - Primary  PHYSICIAN ASSISTANT: Montez Morita, PA-C  ANESTHESIA:   general  EBL:  150 mL   BLOOD ADMINISTERED:none  DRAINS: WOUND VAC   LOCAL MEDICATIONS USED:  NONE  SPECIMEN:  No Specimen  DISPOSITION OF SPECIMEN:  N/A  COUNTS:  YES  TOURNIQUET:  * No tourniquets in log *  DICTATION: .Other Dictation: Dictation Number [id number]  PLAN OF CARE: Admit to inpatient   PATIENT DISPOSITION:  ICU -  intubated and hemodynamically stable.   Delay start of Pharmacological VTE agent (>24hrs) due to surgical blood loss or risk of bleeding: no

## 2018-06-17 NOTE — Progress Notes (Signed)
RT NOTES: Patient in OR. Will check vent when patient returns.

## 2018-06-17 NOTE — Progress Notes (Signed)
Orthopedic Tech Progress Note Patient Details:  Noah Duke 05-06-1991 [id number]  Ortho Devices Ortho Device/Splint Location: Heel boot Ortho Device/Splint Interventions: Application   Post Interventions Patient Tolerated: Well Instructions Provided: Care of device   [name] 06/17/2018, 5:43 PM

## 2018-06-17 NOTE — Transfer of Care (Signed)
Immediate Anesthesia Transfer of Care Note  Patient: Noah Duke  Procedure(s) Performed: INTRAMEDULLARY (IM) NAIL TIBIAL (Left Leg Lower) INTRAMEDULLARY (IM) RETROGRADE FEMORAL NAILING (Left Leg Upper) OPEN REDUCTION INTERNAL FIXATION (ORIF) TIBIAL PLATEAU (Right Leg Lower) IRRIGATION AND DEBRIDEMENT EXTREMITY (Right Leg Lower)  Patient Location: ICU  Anesthesia Type:General  Level of Consciousness: sedated and Patient remains intubated per anesthesia plan  Airway & Oxygen Therapy: Patient remains intubated per anesthesia plan and Patient placed on Ventilator (see vital sign flow sheet for setting)  Post-op Assessment: Report given to RN and Post -op Vital signs reviewed and stable  Post vital signs: Reviewed and stable  Last Vitals:  Vitals Value Taken Time  BP 124/93 06/17/2018  5:02 PM  Temp 35.4 C 06/17/2018  5:06 PM  Pulse 101 06/17/2018  5:06 PM  Resp 18 06/17/2018  5:06 PM  SpO2 99 % 06/17/2018  5:06 PM  Vitals shown include unvalidated device data.  Last Pain:  Vitals:   06/17/18 0800  TempSrc:   PainSc: 4          Complications: No apparent anesthesia complications

## 2018-06-17 NOTE — Progress Notes (Addendum)
Trauma MD paged second time. MD made aware of patient's febrile state and tachycardia. New orders received for blood cultures. This RN asked if MD wanted a cooling blanket. MD did not want a cooling blanket at this time. Will continue to monitor.

## 2018-06-17 NOTE — Anesthesia Postprocedure Evaluation (Signed)
Anesthesia Post Note  Patient: Noah Duke  Procedure(s) Performed: INTRAMEDULLARY (IM) NAIL TIBIAL (Left Leg Lower) INTRAMEDULLARY (IM) RETROGRADE FEMORAL NAILING (Left Leg Upper) OPEN REDUCTION INTERNAL FIXATION (ORIF) TIBIAL PLATEAU (Right Leg Lower) IRRIGATION AND DEBRIDEMENT EXTREMITY (Right Leg Lower)     Patient location during evaluation: PACU Anesthesia Type: General Level of consciousness: awake Pain management: pain level controlled Vital Signs Assessment: post-procedure vital signs reviewed and stable Respiratory status: spontaneous breathing Cardiovascular status: stable Postop Assessment: no apparent nausea or vomiting Anesthetic complications: no    Last Vitals:  Vitals:   06/17/18 1700 06/17/18 1702  BP:  (!) 124/93  Pulse: 100 (!) 102  Resp: 19 (!) 27  Temp: (!) 35.4 C   SpO2: 100% 100%    Last Pain:  Vitals:   06/17/18 0800  TempSrc:   PainSc: 4                  Domingue Coltrain

## 2018-06-17 NOTE — Progress Notes (Signed)
Wasted 20 mL fentanyl with Product/process development scientist

## 2018-06-17 NOTE — Progress Notes (Signed)
RT NOTES: Patient taken to OR. Will do vent check when patient returns.
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
## 2018-06-17 NOTE — Progress Notes (Signed)
RT NOTES: Patient returned from OR. Placed patient on vent with ordered settings. RN at bedside.

## 2018-06-17 NOTE — Anesthesia Procedure Notes (Signed)
Date/Time: 06/17/2018 11:10 AM Performed by: Quentin Ore, CRNA Pre-anesthesia Checklist: Patient identified, Emergency Drugs available, Suction available and Patient being monitored Patient Re-evaluated:Patient Re-evaluated prior to induction Oxygen Delivery Method: Circle system utilized Preoxygenation: Pre-oxygenation with 100% oxygen Induction Type: Inhalational induction with existing ETT Placement Confirmation: positive ETCO2 and breath sounds checked- equal and bilateral

## 2018-06-18 ENCOUNTER — Encounter (HOSPITAL_COMMUNITY): Payer: Self-pay | Admitting: Orthopedic Surgery

## 2018-06-18 DIAGNOSIS — S82452A Displaced comminuted fracture of shaft of left fibula, initial encounter for closed fracture: Secondary | ICD-10-CM

## 2018-06-18 DIAGNOSIS — S82202A Unspecified fracture of shaft of left tibia, initial encounter for closed fracture: Secondary | ICD-10-CM | POA: Diagnosis present

## 2018-06-18 DIAGNOSIS — S7292XA Unspecified fracture of left femur, initial encounter for closed fracture: Secondary | ICD-10-CM

## 2018-06-18 DIAGNOSIS — S82101B Unspecified fracture of upper end of right tibia, initial encounter for open fracture type I or II: Secondary | ICD-10-CM

## 2018-06-18 DIAGNOSIS — S99912A Unspecified injury of left ankle, initial encounter: Secondary | ICD-10-CM | POA: Diagnosis present

## 2018-06-18 HISTORY — DX: Unspecified fracture of left femur, initial encounter for closed fracture: S72.92XA

## 2018-06-18 HISTORY — DX: Displaced comminuted fracture of shaft of left fibula, initial encounter for closed fracture: S82.452A

## 2018-06-18 HISTORY — DX: Unspecified fracture of upper end of right tibia, initial encounter for open fracture type I or II: S82.101B

## 2018-06-18 LAB — GLUCOSE, CAPILLARY
GLUCOSE-CAPILLARY: 119 mg/dL — AB (ref 70–99)
GLUCOSE-CAPILLARY: 111 mg/dL — AB (ref 70–99)
Glucose-Capillary: 110 mg/dL — ABNORMAL HIGH (ref 70–99)
Glucose-Capillary: 113 mg/dL — ABNORMAL HIGH (ref 70–99)
Glucose-Capillary: 120 mg/dL — ABNORMAL HIGH (ref 70–99)
Glucose-Capillary: 134 mg/dL — ABNORMAL HIGH (ref 70–99)

## 2018-06-18 LAB — CBC
HCT: 23.3 % — ABNORMAL LOW (ref 39.0–52.0)
HEMOGLOBIN: 7.5 g/dL — AB (ref 13.0–17.0)
MCH: 32.5 pg (ref 26.0–34.0)
MCHC: 32.2 g/dL (ref 30.0–36.0)
MCV: 100.9 fL — AB (ref 78.0–100.0)
Platelets: 178 10*3/uL (ref 150–400)
RBC: 2.31 MIL/uL — AB (ref 4.22–5.81)
RDW: 12.8 % (ref 11.5–15.5)
WBC: 5.6 10*3/uL (ref 4.0–10.5)

## 2018-06-18 MED ORDER — KETOROLAC TROMETHAMINE 15 MG/ML IJ SOLN
15.0000 mg | Freq: Three times a day (TID) | INTRAMUSCULAR | Status: AC | PRN
  Administered 2018-06-18 – 2018-06-21 (×3): 15 mg via INTRAVENOUS
  Filled 2018-06-18 (×4): qty 1

## 2018-06-18 MED ORDER — VITAMIN E 100 UNT/0.25ML PO OIL
400.0000 [IU] | TOPICAL_OIL | Freq: Three times a day (TID) | ORAL | Status: DC
  Filled 2018-06-18 (×3): qty 1

## 2018-06-18 MED ORDER — FENTANYL CITRATE (PF) 100 MCG/2ML IJ SOLN
50.0000 ug | INTRAMUSCULAR | Status: DC | PRN
  Administered 2018-06-18 – 2018-06-19 (×12): 100 ug via INTRAVENOUS
  Filled 2018-06-18 (×11): qty 2

## 2018-06-18 MED ORDER — PRO-STAT SUGAR FREE PO LIQD: 30.0000 mL | Freq: Two times a day (BID) | ORAL | Status: DC

## 2018-06-18 MED ORDER — ONDANSETRON HCL 4 MG/2ML IJ SOLN
4.0000 mg | Freq: Three times a day (TID) | INTRAMUSCULAR | Status: DC | PRN
  Administered 2018-06-18 – 2018-06-19 (×2): 4 mg via INTRAVENOUS
  Filled 2018-06-18 (×2): qty 2

## 2018-06-18 MED ORDER — IBUPROFEN 100 MG/5ML PO SUSP
600.0000 mg | Freq: Three times a day (TID) | ORAL | Status: DC | PRN
  Administered 2018-06-18: 600 mg
  Filled 2018-06-18: qty 30

## 2018-06-18 MED ORDER — ACETAMINOPHEN 160 MG/5ML PO SOLN: 650.0000 mg | Freq: Four times a day (QID) | ORAL | Status: DC | PRN

## 2018-06-18 MED ORDER — ENOXAPARIN SODIUM 40 MG/0.4ML ~~LOC~~ SOLN
40.0000 mg | SUBCUTANEOUS | Status: DC
Start: 2018-06-18 — End: 2018-06-24
  Administered 2018-06-18 – 2018-06-24 (×5): 40 mg via SUBCUTANEOUS
  Filled 2018-06-18 (×6): qty 0.4

## 2018-06-18 MED ORDER — CHLORHEXIDINE GLUCONATE 0.12 % MT SOLN
15.0000 mL | Freq: Two times a day (BID) | OROMUCOSAL | Status: DC
  Administered 2018-06-18 – 2018-06-20 (×4): 15 mL via OROMUCOSAL
  Filled 2018-06-18 (×3): qty 15

## 2018-06-18 MED ORDER — ORAL CARE MOUTH RINSE
15.0000 mL | Freq: Two times a day (BID) | OROMUCOSAL | Status: DC
  Administered 2018-06-18 – 2018-06-20 (×6): 15 mL via OROMUCOSAL

## 2018-06-18 MED ORDER — PIPERACILLIN-TAZOBACTAM 3.375 G IVPB
3.3750 g | Freq: Three times a day (TID) | INTRAVENOUS | Status: DC
  Administered 2018-06-18 – 2018-06-24 (×17): 3.375 g via INTRAVENOUS
  Filled 2018-06-18 (×19): qty 50

## 2018-06-18 MED ORDER — VITAMIN C 500 MG PO TABS
1000.0000 mg | ORAL_TABLET | Freq: Three times a day (TID) | ORAL | Status: AC
  Administered 2018-06-18 – 2018-06-21 (×7): 1000 mg via ORAL
  Filled 2018-06-18 (×7): qty 2

## 2018-06-18 NOTE — Progress Notes (Signed)
Trauma MD made aware of patient's ongoing pain issues. New orders received, will continue to monitor.

## 2018-06-18 NOTE — Progress Notes (Signed)
Orthopedic Tech Progress Note Patient Details:  Noah Duke 22-Jun-1991 [id number]  Ortho Devices Ortho Device/Splint Location: Prafo boot Ortho Device/Splint Interventions: Application   Post Interventions Patient Tolerated: Well Instructions Provided: Care of device   [name] 06/18/2018, 11:46 AM

## 2018-06-18 NOTE — Progress Notes (Signed)
MD paged and informed about patient's febrile state that maintained throughout the night. This RN has given acetaminophen as often as orders allowed, and kept ice packs on patient during the night. Ice was changed when no longer cold. Patient was kept uncovered and the room maintained at coolest temperature. MD gave new orders for cooling blanket if needed and ibuprofen. Will continue to monitor.

## 2018-06-18 NOTE — Procedures (Signed)
Extubation Procedure Note  Patient Details:   Name: Noah Duke DOB: [birth year] MRN: [id number]   Airway Documentation:    Vent end date: 06/18/18 Vent end time: 0837   Evaluation  O2 sats: stable throughout Complications: No apparent complications Patient did tolerate procedure well. Bilateral Breath Sounds: Clear, Diminished   Yes   Pt extubated to 3L [place] per MD order. Pt had positive cuff leak prior to extubation. VS within normal limits. No stridor noted, No distress noted. Pt able to speak and has a strong productive cough post extubation.   Ermalinda Barrios M 06/18/2018, 8:42 AM
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
## 2018-06-18 NOTE — Progress Notes (Signed)
Patient ID: Noah Duke, male   DOB: [birth year], 27 y.o.   MRN: [id number] Follow up - Trauma Critical Care  Patient Details:    Noah Duke is an 27 y.o. male.  Lines/tubes : Airway 7.5 mm (Active)  Secured at (cm) 25 cm 06/18/2018  3:15 AM  Measured From Lips 06/18/2018  3:15 AM  Secured Location Left 06/18/2018  3:15 AM  Secured By [employer] 06/18/2018  3:15 AM  Tube Holder Repositioned Yes 06/18/2018  3:15 AM  Cuff Pressure (cm H2O) 25 cm H2O 06/17/2018  8:31 PM  Site Condition Dry 06/18/2018  3:15 AM     Negative Pressure Wound Therapy Leg Right;Anterior (Active)  Site / Wound Assessment Dressing in place / Unable to assess 06/17/2018  8:00 PM  Target Pressure (mmHg) 125 06/17/2018  8:00 PM  Drainage Description Serosanguineous 06/17/2018  8:00 PM  Output (mL) 130 mL 06/18/2018  6:00 AM     NG/OG Tube Orogastric 18 Fr. Left mouth Xray Measured external length of tube 2 cm (Active)  External Length of Tube (cm) - (if applicable) 47 cm 06/17/2018  8:00 PM  Site Assessment Clean;Dry;Intact 06/17/2018  8:00 PM  Ongoing Placement Verification No change in respiratory status;No acute changes, not attributed to clinical condition 06/17/2018  8:00 PM  Status Clamped 06/17/2018  8:00 PM  Drainage Appearance Yellow 06/14/2018  5:15 PM  Intake (mL) 90 mL 06/18/2018  5:45 AM  Output (mL) 500 mL 06/14/2018  5:15 PM     Urethral Catheter Loistine Chance, RN Double-lumen;Non-latex 16 Fr. (Active)  Indication for Insertion or Continuance of Catheter Peri-operative use for selective surgical procedure 06/17/2018  8:00 PM  Site Assessment Clean;Intact 06/17/2018  8:00 PM  Catheter Maintenance Bag below level of bladder;Catheter secured;Drainage bag/tubing not touching floor;Seal intact;No dependent loops;Insertion date on drainage bag 06/17/2018  8:00 PM  Collection Container Standard drainage bag 06/17/2018  8:00 PM  Securement Method Securing device (Describe) 06/17/2018  8:00 PM  Output (mL) 140  mL 06/18/2018  6:00 AM    Microbiology/Sepsis markers: Results for orders placed or performed during the hospital encounter of 06/14/18  MRSA PCR Screening     Status: None   Collection Time: 06/14/18 10:39 PM  Result Value Ref Range Status   MRSA by PCR NEGATIVE NEGATIVE Final    Comment:        The GeneXpert MRSA Assay (FDA approved for NASAL specimens only), is one component of a comprehensive MRSA colonization surveillance program. It is not intended to diagnose MRSA infection nor to guide or monitor treatment for MRSA infections. Performed at [facility], [address] [place], [place] [address]     Anti-infectives:  Anti-infectives (From admission, onward)   Start     Dose/Rate Route Frequency Ordered Stop   06/17/18 1435  vancomycin (VANCOCIN) powder  Status:  Discontinued       As needed 06/17/18 1435 06/17/18 1651   06/17/18 1434  tobramycin (NEBCIN) powder  Status:  Discontinued       As needed 06/17/18 1434 06/17/18 1651   06/15/18 0200  ceFAZolin (ANCEF) IVPB 1 g/50 mL premix     1 g 100 mL/hr over 30 Minutes Intravenous Every 8 hours 06/14/18 2218     06/14/18 2030  ceFAZolin (ANCEF) IVPB 1 g/50 mL premix  Status:  Discontinued     1 g 100 mL/hr over 30 Minutes Intravenous  Once 06/14/18 2117 06/14/18 2136   06/14/18 1715  ceFAZolin (  ANCEF) IVPB 2g/100 mL premix     2 g 200 mL/hr over 30 Minutes Intravenous  Once 06/14/18 1707 06/14/18 1816      Best Practice/Protocols:  VTE Prophylaxis: Lovenox (prophylaxtic dose) Continous Sedation  Consults: Treatment Team:  Myrene Galas, MD    Studies:    Events:  Subjective:    Overnight Issues:   Objective:  Vital signs for last 24 hours: Temp:  [95.7 F (35.4 C)-103.8 F (39.9 C)] 102.6 F (39.2 C) (08/20 0700) Pulse Rate:  [77-135] 92 (08/20 0700) Resp:  [16-27] 22 (08/20 0700) BP: (117-156)/(55-93) 131/55 (08/20 0700) SpO2:  [92 %-100 %] 97 % (08/20 0700) FiO2 (%):  [30 %] 30 %  (08/20 0700) Weight:  [94.1 kg] 94.1 kg (08/20 0146)  Hemodynamic parameters for last 24 hours:    Intake/Output from previous day: 08/19 0701 - 08/20 0700 In: 4584.4 [I.V.:3555.6; NG/GT:437.5; IV Piggyback:591.4] Out: 1950 [Urine:1670; Drains:130; Blood:150]  Intake/Output this shift: No intake/output data recorded.  Vent settings for last 24 hours: Vent Mode: PRVC FiO2 (%):  [30 %] 30 % Set Rate:  [22 bmp] 22 bmp Vt Set:  [550 mL] 550 mL PEEP:  [5 cmH20] 5 cmH20 Plateau Pressure:  [14 cmH20-17 cmH20] 17 cmH20  Physical Exam:  General: on vent Neuro: arouses and F/C HEENT/Neck: ETT Resp: clear to auscultation bilaterally CVS: regular rate and rhythm, S1, S2 normal, no murmur, click, rub or gallop GI: soft, nontender, BS WNL, no r/g Extremities: BLE ortho drsg  Results for orders placed or performed during the hospital encounter of 06/14/18 (from the past 24 hour(s))  Triglycerides     Status: None   Collection Time: 06/17/18  5:11 PM  Result Value Ref Range   Triglycerides 111 <150 mg/dL  Glucose, capillary     Status: None   Collection Time: 06/17/18  8:07 PM  Result Value Ref Range   Glucose-Capillary 97 70 - 99 mg/dL  Glucose, capillary     Status: Abnormal   Collection Time: 06/17/18 11:40 PM  Result Value Ref Range   Glucose-Capillary 111 (H) 70 - 99 mg/dL  Glucose, capillary     Status: Abnormal   Collection Time: 06/18/18  3:37 AM  Result Value Ref Range   Glucose-Capillary 134 (H) 70 - 99 mg/dL  CBC     Status: Abnormal   Collection Time: 06/18/18  4:00 AM  Result Value Ref Range   WBC 5.6 4.0 - 10.5 K/uL   RBC 2.31 (L) 4.22 - 5.81 MIL/uL   Hemoglobin 7.5 (L) 13.0 - 17.0 g/dL   HCT 16.1 (L) 09.6 - 04.5 %   MCV 100.9 (H) 78.0 - 100.0 fL   MCH 32.5 26.0 - 34.0 pg   MCHC 32.2 30.0 - 36.0 g/dL   RDW 40.9 81.1 - 91.4 %   Platelets 178 150 - 400 K/uL    Assessment & Plan: Present on Admission: . Bilateral tibial fractures    LOS: 4 days    Additional comments:I reviewed the patient's new clinical lab test results. Marland Kitchen [facility] L femur FX - S/P Ex fix by Dr. Roda Shutters 8/16, per ortho, S/P IM nail by Dr. Carola Frost 8/18 R open tib fib FX - S/P I7D, VAC, Ex fix bu Dr. Roda Shutters 8/6, S/P ORIF by Dr. Carola Frost 8/20 L tib fib FX - S/P Ex fix by Dr. Roda Shutters 8/16, S/P IM nail by Dr. Carola Frost 8/20 Cystic liver lesion Acute hypercarbic ventilator dependent resp failure - extubate AKI - resolved ID -  open FX, fever on Ancef, change to Zosyn, blood CXs, I dont think intra-op CX were deemed necessary FEN - hold TF for extubation VTE - Lovenox Dispo - ICU I spoke with his parents Critical Care Total Time*: 74 Minutes  Violeta Gelinas, MD, MPH, FACS Trauma: [phone] General Surgery: [phone]  06/18/2018  *Care during the described time interval was provided by me. I have reviewed this patient's available data, including medical history, events of note, physical examination and test results as part of my evaluation.

## 2018-06-19 ENCOUNTER — Inpatient Hospital Stay (HOSPITAL_COMMUNITY): Payer: No Typology Code available for payment source

## 2018-06-19 ENCOUNTER — Encounter (HOSPITAL_COMMUNITY): Payer: Self-pay | Admitting: Orthopedic Surgery

## 2018-06-19 LAB — CBC
HEMATOCRIT: 21.8 % — AB (ref 39.0–52.0)
HEMATOCRIT: 22.4 % — AB (ref 39.0–52.0)
HEMOGLOBIN: 7.3 g/dL — AB (ref 13.0–17.0)
Hemoglobin: 7.1 g/dL — ABNORMAL LOW (ref 13.0–17.0)
MCH: 31.8 pg (ref 26.0–34.0)
MCH: 31.5 pg (ref 26.0–34.0)
MCHC: 32.6 g/dL (ref 30.0–36.0)
MCHC: 32.6 g/dL (ref 30.0–36.0)
MCV: 97.8 fL (ref 78.0–100.0)
MCV: 96.6 fL (ref 78.0–100.0)
Platelets: 197 10*3/uL (ref 150–400)
Platelets: 206 10*3/uL (ref 150–400)
RBC: 2.23 MIL/uL — ABNORMAL LOW (ref 4.22–5.81)
RBC: 2.32 MIL/uL — AB (ref 4.22–5.81)
RDW: 12.2 % (ref 11.5–15.5)
RDW: 12.2 % (ref 11.5–15.5)
WBC: 7.2 10*3/uL (ref 4.0–10.5)
WBC: 7.1 10*3/uL (ref 4.0–10.5)

## 2018-06-19 LAB — GLUCOSE, CAPILLARY
GLUCOSE-CAPILLARY: 118 mg/dL — AB (ref 70–99)
GLUCOSE-CAPILLARY: 96 mg/dL (ref 70–99)
Glucose-Capillary: 106 mg/dL — ABNORMAL HIGH (ref 70–99)
Glucose-Capillary: 122 mg/dL — ABNORMAL HIGH (ref 70–99)
Glucose-Capillary: 95 mg/dL (ref 70–99)

## 2018-06-19 LAB — BASIC METABOLIC PANEL
ANION GAP: 9 (ref 5–15)
BUN: 6 mg/dL (ref 6–20)
CO2: 26 mmol/L (ref 22–32)
Calcium: 7.8 mg/dL — ABNORMAL LOW (ref 8.9–10.3)
Chloride: 101 mmol/L (ref 98–111)
Creatinine, Ser: 0.85 mg/dL (ref 0.61–1.24)
GFR calc Af Amer: >60 mL/min (ref >60)
GFR calc non Af Amer: >60 mL/min (ref >60)
GLUCOSE: 106 mg/dL — AB (ref 70–99)
POTASSIUM: 3.3 mmol/L — AB (ref 3.5–5.1)
Sodium: 136 mmol/L (ref 135–145)

## 2018-06-19 LAB — PREPARE RBC (CROSSMATCH)

## 2018-06-19 LAB — SURGICAL PCR SCREEN
MRSA, PCR: NEGATIVE
STAPHYLOCOCCUS AUREUS: NEGATIVE

## 2018-06-19 LAB — EXPECTORATED SPUTUM ASSESSMENT W REFEX TO RESP CULTURE: SPECIAL REQUESTS: NORMAL

## 2018-06-19 MED ORDER — FUROSEMIDE 10 MG/ML IJ SOLN
20.0000 mg | Freq: Once | INTRAMUSCULAR | Status: AC
  Administered 2018-06-20: 20 mg via INTRAVENOUS
  Filled 2018-06-19: qty 2

## 2018-06-19 MED ORDER — SODIUM CHLORIDE 0.9% IV SOLUTION: Freq: Once | INTRAVENOUS | Status: DC

## 2018-06-19 MED ORDER — METHOCARBAMOL 750 MG PO TABS
750.0000 mg | ORAL_TABLET | Freq: Three times a day (TID) | ORAL | Status: DC
  Administered 2018-06-19 – 2018-06-24 (×16): 750 mg via ORAL
  Filled 2018-06-19: qty 1
  Filled 2018-06-19: qty 2
  Filled 2018-06-19 (×7): qty 1
  Filled 2018-06-19: qty 2
  Filled 2018-06-19: qty 1
  Filled 2018-06-19: qty 2
  Filled 2018-06-19 (×4): qty 1

## 2018-06-19 MED ORDER — FENTANYL CITRATE (PF) 100 MCG/2ML IJ SOLN: 50.0000 ug | INTRAMUSCULAR | Status: DC | PRN

## 2018-06-19 MED ORDER — ACETAMINOPHEN 500 MG PO TABS
1000.0000 mg | ORAL_TABLET | Freq: Three times a day (TID) | ORAL | Status: DC
  Administered 2018-06-19 – 2018-06-21 (×5): 1000 mg via ORAL
  Filled 2018-06-19 (×5): qty 2

## 2018-06-19 MED ORDER — DIPHENHYDRAMINE HCL 25 MG PO CAPS
25.0000 mg | ORAL_CAPSULE | Freq: Once | ORAL | Status: AC
  Administered 2018-06-19: 25 mg via ORAL
  Filled 2018-06-19: qty 1

## 2018-06-19 MED ORDER — HYDROMORPHONE HCL 1 MG/ML IJ SOLN
1.0000 mg | INTRAMUSCULAR | Status: DC | PRN
  Administered 2018-06-19 – 2018-06-22 (×15): 1 mg via INTRAVENOUS
  Filled 2018-06-19 (×15): qty 1

## 2018-06-19 MED ORDER — ENSURE ENLIVE PO LIQD
237.0000 mL | Freq: Two times a day (BID) | ORAL | Status: DC
  Administered 2018-06-19 – 2018-06-24 (×7): 237 mL via ORAL

## 2018-06-19 MED ORDER — GABAPENTIN 600 MG PO TABS
300.0000 mg | ORAL_TABLET | Freq: Two times a day (BID) | ORAL | Status: DC
  Administered 2018-06-19 – 2018-06-21 (×5): 300 mg via ORAL
  Filled 2018-06-19 (×2): qty 0.5
  Filled 2018-06-19: qty 1
  Filled 2018-06-19: qty 0.5
  Filled 2018-06-19 (×2): qty 1

## 2018-06-19 MED ORDER — ACETAMINOPHEN 325 MG PO TABS
650.0000 mg | ORAL_TABLET | Freq: Once | ORAL | Status: AC
  Administered 2018-06-19: 650 mg via ORAL
  Filled 2018-06-19: qty 2

## 2018-06-19 MED ORDER — OXYCODONE HCL 5 MG PO TABS
10.0000 mg | ORAL_TABLET | ORAL | Status: DC | PRN
  Administered 2018-06-19 – 2018-06-21 (×8): 10 mg via ORAL
  Filled 2018-06-19 (×8): qty 2

## 2018-06-19 MED ORDER — OXYCODONE HCL 5 MG PO TABS
10.0000 mg | ORAL_TABLET | ORAL | Status: DC | PRN
  Administered 2018-06-19: 10 mg via ORAL
  Filled 2018-06-19: qty 2

## 2018-06-19 MED ORDER — VITAMIN E 180 MG (400 UNIT) PO CAPS
400.0000 [IU] | ORAL_CAPSULE | Freq: Three times a day (TID) | ORAL | Status: AC
  Administered 2018-06-19 – 2018-06-21 (×6): 400 [IU] via ORAL
  Filled 2018-06-19 (×8): qty 1

## 2018-06-19 NOTE — Plan of Care (Signed)
Pt able to tolerate regular diet, progressing with pain management after addition of oral medications. Still experiencing mild nausea, no vomiting. Will continue to monitor.
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
## 2018-06-19 NOTE — Progress Notes (Addendum)
[facility] Progress Note  2 Days Post-Op  Subjective: CC-  Doing well post-extubation, but does state that he feels a little SOB and has a productive cough. Denies any current abdominal pain but he did have 1 episode of emesis over night and still feels nauseated. BM last night. Passing flatus this morning. Tolerated clears for breakfast. Required fentanyl nearly every hour last night.  Objective: Vital signs in last 24 hours: Temp:  [98.2 F (36.8 C)-100.6 F (38.1 C)] 99 F (37.2 C) (08/21 0900) Pulse Rate:  [77-103] 90 (08/21 0900) Resp:  [14-31] 22 (08/21 0900) BP: (142-166)/(75-111) 145/82 (08/21 0900) SpO2:  [91 %-100 %] 97 % (08/21 0900) Weight:  [95.7 kg] 95.7 kg (08/21 0500) Last BM Date: 06/19/18  Intake/Output from previous day: 08/20 0701 - 08/21 0700 In: 1260.3 [I.V.:1075.1; NG/GT:50; IV Piggyback:135.2] Out: 1106 [Urine:1005; Emesis/NG output:1; Drains:100] Intake/Output this shift: Total I/O In: 124.8 [I.V.:100.1; IV Piggyback:24.7] Out: -   PE: Gen:  Alert, NAD HEENT: EOM's intact, pupils equal and round. No pain with neck ROM, no c-spine TTP Card:  RRR Pulm:  CTAB, no W/R/R, effort normal on RA Abd: Soft, NT/ND, +BS Ext:  Dressings to BLE, toes WWP with good cap refill, able to wiggle toes Psych: A&Ox3 Skin: no rashes noted, warm and dry  Lab Results:  Recent Labs    06/18/18 0400 06/19/18 0225  WBC 5.6 7.2  HGB 7.5* 7.1*  HCT 23.3* 21.8*  PLT 178 197   BMET Recent Labs    06/17/18 0312 06/19/18 0225  NA 137 136  K 3.4* 3.3*  CL 104 101  CO2 26 26  GLUCOSE 132* 106*  BUN 8 6  CREATININE 0.95 0.85  CALCIUM 7.8* 7.8*   PT/INR No results for input(s): LABPROT, INR in the last 72 hours. CMP     Component Value Date/Time   NA 136 06/19/2018 0225   K 3.3 (L) 06/19/2018 0225   CL 101 06/19/2018 0225   CO2 26 06/19/2018 0225   GLUCOSE 106 (H) 06/19/2018 0225   BUN 6 06/19/2018 0225   CREATININE 0.85 06/19/2018 0225    CALCIUM 7.8 (L) 06/19/2018 0225   PROT 7.8 06/14/2018 1647   ALBUMIN 4.6 06/14/2018 1647   AST 81 (H) 06/14/2018 1647   ALT 49 (H) 06/14/2018 1647   ALKPHOS 35 (L) 06/14/2018 1647   BILITOT 1.7 (H) 06/14/2018 1647   GFRNONAA >60 06/19/2018 0225   GFRAA >60 06/19/2018 0225   Lipase  No results found for: LIPASE     Studies/Results: Dg Tibia/fibula Left  Result Date: 06/17/2018 CLINICAL DATA:  27 y/o  M; ORIF of left tibia fracture. EXAM: LEFT TIBIA AND FIBULA - 2 VIEW COMPARISON:  06/14/2018 left tibia fibula radiographs. FINDINGS: Intraoperative fluoroscopy. Intramedullary nail fixation of the tibial shaft fracture with near anatomic alignment post fixation. Comminuted bone fragments surround the midshaft fracture. Improved alignment of the proximal fibular fracture without residual angulation or overriding. Fluoro time is 3 minutes 28 seconds. IMPRESSION: ORIF of left tibia fracture.  Fluoro time is 3 minutes 28 seconds. Electronically Signed   By: Mitzi Hansen M.D.   On: 06/17/2018 16:38   Dg Tibia/fibula Right  Result Date: 06/17/2018 CLINICAL DATA:  27 y/o  M; ORIF of tibia fractures. EXAM: DG C-ARM 61-120 MIN; RIGHT TIBIA AND FIBULA - 2 VIEW COMPARISON:  06/14/2018 right tibia fibula radiographs FINDINGS: Four intraoperative fluoroscopic images of upper tibia and fibular fractures. There is a lateral plate fixed to  the tibia extending from the plateau to the lower shaft fixed by multiple screws. There are 2 obliquely oriented screws traversing the fracture. There is a small plate on the medial aspect of the fracture line fixed by 4 small screws. Fluoro time is 3 minutes and 28 seconds. IMPRESSION: Intraoperative fluoroscopy of ORIF of right proximal tibia fracture. Fluoro time is 3 minutes 28 seconds. Electronically Signed   By: Mitzi Hansen M.D.   On: 06/17/2018 16:30   Dg Chest Port 1 View  Result Date: 06/17/2018 CLINICAL DATA:  Trauma patient is status  post multiple orthopedic surgeries. EXAM: PORTABLE CHEST 1 VIEW COMPARISON:  Chest x-ray from earlier same day. FINDINGS: Endotracheal tube is stable in position with tip approximately 4 cm above the carina. Enteric tube passes below the diaphragm. Heart size and mediastinal contours are stable. Mild central pulmonary vascular congestion without overt alveolar pulmonary edema. Lungs otherwise clear. No pleural effusion or pneumothorax seen. Osseous structures about the chest are unremarkable. IMPRESSION: 1. Mild central pulmonary vascular congestion. Lungs otherwise clear. 2. Support apparatus appears appropriately positioned. Electronically Signed   By: Bary Richard M.D.   On: 06/17/2018 19:15   Dg Knee Right Port  Result Date: 06/17/2018 CLINICAL DATA:  Trauma patient, status post multiple orthopedic surgeries. EXAM: PORTABLE RIGHT KNEE - 1-2 VIEW COMPARISON:  Plain film dated 06/14/2018. FINDINGS: Plate and screw fixation hardware at the proximal RIGHT tibia appears intact and appropriately positioned. Alignment of the proximal tibia and fibula is significantly improved compared to the earlier exam. Osseous alignment at the RIGHT knee joint appears anatomic. Expected postsurgical changes within the overlying soft tissues. IMPRESSION: Status post plate and screw fixation of the proximal RIGHT tibia. Significant improvement in osseous alignment at the fracture sites. No evidence of surgical complicating feature. Electronically Signed   By: Bary Richard M.D.   On: 06/17/2018 19:11   Dg Tibia/fibula Left Port  Result Date: 06/17/2018 CLINICAL DATA:  Trauma patient is status post multiple orthopedic surgeries. EXAM: PORTABLE LEFT TIBIA AND FIBULA - 2 VIEW COMPARISON:  None. FINDINGS: Intramedullary rod and fixation screws traversing the mid LEFT femur fracture. Hardware appears intact and appropriately positioned. Osseous alignment at the mid tibia fracture site appears significantly improved, now near  anatomic in alignment. Alignment of the proximal fibula fracture is also significantly improved common also near anatomic. IMPRESSION: Fixation hardware traversing the mid LEFT tibia fracture site appears intact and appropriately positioned. Osseous alignment is significantly improved at the fracture sites. No evidence of surgical complicating feature. Electronically Signed   By: Bary Richard M.D.   On: 06/17/2018 19:13   Dg Tibia/fibula Right Port  Result Date: 06/17/2018 CLINICAL DATA:  Trauma patient is status post multiple orthopedic surgeries. EXAM: PORTABLE RIGHT TIBIA AND FIBULA - 2 VIEW COMPARISON:  None. FINDINGS: Plate and screw fixation hardware within the RIGHT tibia appears intact and appropriately positioned throughout. Osseous alignment is significantly improved compared to previous plain film exam of 06/14/2018, now near anatomic. IMPRESSION: Status post plate and screw fixation of the RIGHT tibia. Osseous alignment is significantly improved, now near anatomic. No evidence of surgical complicating feature. Electronically Signed   By: Bary Richard M.D.   On: 06/17/2018 19:14   Dg C-arm 1-60 Min  Result Date: 06/17/2018 CLINICAL DATA:  27 y/o  M; ORIF of tibia fractures. EXAM: DG C-ARM 61-120 MIN; RIGHT TIBIA AND FIBULA - 2 VIEW COMPARISON:  06/14/2018 right tibia fibula radiographs FINDINGS: Four intraoperative fluoroscopic images of upper tibia  and fibular fractures. There is a lateral plate fixed to the tibia extending from the plateau to the lower shaft fixed by multiple screws. There are 2 obliquely oriented screws traversing the fracture. There is a small plate on the medial aspect of the fracture line fixed by 4 small screws. Fluoro time is 3 minutes and 28 seconds. IMPRESSION: Intraoperative fluoroscopy of ORIF of right proximal tibia fracture. Fluoro time is 3 minutes 28 seconds. Electronically Signed   By: Mitzi Hansen M.D.   On: 06/17/2018 16:30   Dg C-arm 1-60  Min  Result Date: 06/17/2018 CLINICAL DATA:  27 y/o  M; ORIF of tibia fractures. EXAM: DG C-ARM 61-120 MIN; RIGHT TIBIA AND FIBULA - 2 VIEW COMPARISON:  06/14/2018 right tibia fibula radiographs FINDINGS: Four intraoperative fluoroscopic images of upper tibia and fibular fractures. There is a lateral plate fixed to the tibia extending from the plateau to the lower shaft fixed by multiple screws. There are 2 obliquely oriented screws traversing the fracture. There is a small plate on the medial aspect of the fracture line fixed by 4 small screws. Fluoro time is 3 minutes and 28 seconds. IMPRESSION: Intraoperative fluoroscopy of ORIF of right proximal tibia fracture. Fluoro time is 3 minutes 28 seconds. Electronically Signed   By: Mitzi Hansen M.D.   On: 06/17/2018 16:30   Dg C-arm 1-60 Min  Result Date: 06/17/2018 CLINICAL DATA:  27 y/o  M; ORIF of tibia fractures. EXAM: DG C-ARM 61-120 MIN; RIGHT TIBIA AND FIBULA - 2 VIEW COMPARISON:  06/14/2018 right tibia fibula radiographs FINDINGS: Four intraoperative fluoroscopic images of upper tibia and fibular fractures. There is a lateral plate fixed to the tibia extending from the plateau to the lower shaft fixed by multiple screws. There are 2 obliquely oriented screws traversing the fracture. There is a small plate on the medial aspect of the fracture line fixed by 4 small screws. Fluoro time is 3 minutes and 28 seconds. IMPRESSION: Intraoperative fluoroscopy of ORIF of right proximal tibia fracture. Fluoro time is 3 minutes 28 seconds. Electronically Signed   By: Mitzi Hansen M.D.   On: 06/17/2018 16:30   Dg Femur Min 2 Views Left  Result Date: 06/17/2018 CLINICAL DATA:  27 y/o  M; ORIF of left femur fracture. EXAM: LEFT FEMUR 2 VIEWS COMPARISON:  06/14/2018 left femur radiographs. FINDINGS: Intramedullary nail fixation of a left femur mid shaft fracture with improved alignment post fixation and minimal residual anterior displacement of  the proximal component. Fluoro time is 3 minutes 28 seconds. IMPRESSION: ORIF of left femur fracture. Fluoro time is 3 minutes and 28 seconds. Electronically Signed   By: Mitzi Hansen M.D.   On: 06/17/2018 16:34   Dg Femur Port Min 2 Views Left  Result Date: 06/17/2018 CLINICAL DATA:  Trauma patient is status post multiple orthopedic surgeries. EXAM: LEFT FEMUR PORTABLE 2 VIEWS COMPARISON:  None. FINDINGS: Intramedullary rod and associated fixation screws traversing the mid diaphyseal femur fracture appear intact and appropriately positioned. Alignment at the fracture site is significantly improved compared to plain film of 06/14/2018. IMPRESSION: Fixation hardware appears intact and appropriately positioned traversing the mid femur fracture site. Osseous alignment is significantly improved. No evidence of surgical complicating feature. Electronically Signed   By: Bary Richard M.D.   On: 06/17/2018 19:09    Anti-infectives: Anti-infectives (From admission, onward)   Start     Dose/Rate Route Frequency Ordered Stop   06/18/18 0900  piperacillin-tazobactam (ZOSYN) IVPB 3.375 g  3.375 g 12.5 mL/hr over 240 Minutes Intravenous Every 8 hours 06/18/18 0818     06/17/18 1435  vancomycin (VANCOCIN) powder  Status:  Discontinued       As needed 06/17/18 1435 06/17/18 1651   06/17/18 1434  tobramycin (NEBCIN) powder  Status:  Discontinued       As needed 06/17/18 1434 06/17/18 1651   06/15/18 0200  ceFAZolin (ANCEF) IVPB 1 g/50 mL premix  Status:  Discontinued     1 g 100 mL/hr over 30 Minutes Intravenous Every 8 hours 06/14/18 2218 06/18/18 0817   06/14/18 2030  ceFAZolin (ANCEF) IVPB 1 g/50 mL premix  Status:  Discontinued     1 g 100 mL/hr over 30 Minutes Intravenous  Once 06/14/18 2117 06/14/18 2136   06/14/18 1715  ceFAZolin (ANCEF) IVPB 2g/100 mL premix     2 g 200 mL/hr over 30 Minutes Intravenous  Once 06/14/18 1707 06/14/18 1816       Assessment/Plan [facility] L femur FX -  S/P Ex fix by Dr. Roda Shutters 8/16, per ortho, S/P IM nail by Dr. Carola Frost 8/18 R open tib fib FX - S/P I7D, VAC, Ex fix bu Dr. Roda Shutters 8/6, S/P ORIF by Dr. Carola Frost 8/20 L tib fib FX - S/P Ex fix by Dr. Roda Shutters 8/16, S/P IM nail by Dr. Carola Frost 8/20 Cystic liver lesion Acute hypercarbic ventilator dependent resp failure - extubated 8/20, doing well AKI - resolved ABL anemia - Hg 7.1 from 7.5, CBC ordered for 1400 today ID - open FX, fever on Ancef 8/16>>8/20, Zosyn 8/20>>, blood CXs P, check CXR and sputum cx FEN - reg diet, IVF VTE - Lovenox Dispo - Transfer to [facility] north. C-spine cleared. Advance diet. Add oral pain medications. Check CXR and sputum cx. OR tomorrow with ortho.   LOS: 5 days    Franne Forts , [facility] 06/19/2018, 10:36 AM Pager: [phone] Consults: [phone] Mon 7:00 am -11:30 AM Tues-Fri 7:00 am-4:30 pm Sat-Sun 7:00 am-11:30 am
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
## 2018-06-19 NOTE — Progress Notes (Signed)
Paged Trauma MD. MD made aware of patient's new onset of abdominal pain. No new orders received from MD at this time. I asked if I should hold ketorolac incase of GI issues, MD said no need to hold. Will continue to monitor.

## 2018-06-20 ENCOUNTER — Inpatient Hospital Stay (HOSPITAL_COMMUNITY): Payer: No Typology Code available for payment source | Admitting: Certified Registered Nurse Anesthetist

## 2018-06-20 ENCOUNTER — Encounter (HOSPITAL_COMMUNITY): Payer: Self-pay | Admitting: Certified Registered"

## 2018-06-20 ENCOUNTER — Encounter (HOSPITAL_COMMUNITY): Admission: EM | Disposition: A | Discharge status: Home Health | Payer: Self-pay | Source: Home / Self Care

## 2018-06-20 HISTORY — PX: I & D EXTREMITY: SHX5045

## 2018-06-20 HISTORY — PX: DRESSING CHANGE UNDER ANESTHESIA: SHX5237

## 2018-06-20 LAB — TRIGLYCERIDES: Triglycerides: 82 mg/dL (ref <150)

## 2018-06-20 LAB — CBC
HCT: 29.7 % — ABNORMAL LOW (ref 39.0–52.0)
Hemoglobin: 10 g/dL — ABNORMAL LOW (ref 13.0–17.0)
MCH: 31.2 pg (ref 26.0–34.0)
MCHC: 33.7 g/dL (ref 30.0–36.0)
MCV: 92.5 fL (ref 78.0–100.0)
Platelets: 291 10*3/uL (ref 150–400)
RBC: 3.21 MIL/uL — AB (ref 4.22–5.81)
RDW: 14.6 % (ref 11.5–15.5)
WBC: 7.7 10*3/uL (ref 4.0–10.5)

## 2018-06-20 LAB — BASIC METABOLIC PANEL
ANION GAP: 10 (ref 5–15)
BUN: 7 mg/dL (ref 6–20)
CO2: 25 mmol/L (ref 22–32)
CREATININE: 0.94 mg/dL (ref 0.61–1.24)
Calcium: 8.1 mg/dL — ABNORMAL LOW (ref 8.9–10.3)
Chloride: 103 mmol/L (ref 98–111)
GFR calc non Af Amer: >60 mL/min (ref >60)
Glucose, Bld: 105 mg/dL — ABNORMAL HIGH (ref 70–99)
Potassium: 3.6 mmol/L (ref 3.5–5.1)
SODIUM: 138 mmol/L (ref 135–145)

## 2018-06-20 LAB — GLUCOSE, CAPILLARY
GLUCOSE-CAPILLARY: 95 mg/dL (ref 70–99)
Glucose-Capillary: 102 mg/dL — ABNORMAL HIGH (ref 70–99)
Glucose-Capillary: 93 mg/dL (ref 70–99)

## 2018-06-20 SURGERY — IRRIGATION AND DEBRIDEMENT EXTREMITY
Anesthesia: General | Site: Leg Lower | Laterality: Right

## 2018-06-20 MED ORDER — LACTATED RINGERS IV SOLN
INTRAVENOUS | Status: DC | PRN
  Administered 2018-06-20: 07:00:00 via INTRAVENOUS

## 2018-06-20 MED ORDER — PROPOFOL 10 MG/ML IV BOLUS
INTRAVENOUS | Status: DC | PRN
  Administered 2018-06-20: 200 mg via INTRAVENOUS

## 2018-06-20 MED ORDER — ROCURONIUM BROMIDE 50 MG/5ML IV SOSY
PREFILLED_SYRINGE | INTRAVENOUS | Status: AC
  Filled 2018-06-20: qty 5

## 2018-06-20 MED ORDER — FAMOTIDINE 20 MG PO TABS
20.0000 mg | ORAL_TABLET | Freq: Every day | ORAL | Status: DC
  Administered 2018-06-20 – 2018-06-23 (×4): 20 mg via ORAL
  Filled 2018-06-20 (×4): qty 1

## 2018-06-20 MED ORDER — KETAMINE HCL 50 MG/5ML IJ SOSY
PREFILLED_SYRINGE | INTRAMUSCULAR | Status: AC
  Filled 2018-06-20: qty 5

## 2018-06-20 MED ORDER — OXYCODONE HCL 5 MG/5ML PO SOLN: 5.0000 mg | Freq: Once | ORAL | Status: AC | PRN

## 2018-06-20 MED ORDER — FENTANYL CITRATE (PF) 250 MCG/5ML IJ SOLN
INTRAMUSCULAR | Status: AC
  Filled 2018-06-20: qty 5

## 2018-06-20 MED ORDER — OXYCODONE HCL 5 MG PO TABS
ORAL_TABLET | ORAL | Status: AC
  Filled 2018-06-20: qty 1

## 2018-06-20 MED ORDER — LIDOCAINE 2% (20 MG/ML) 5 ML SYRINGE
INTRAMUSCULAR | Status: AC
  Filled 2018-06-20: qty 5

## 2018-06-20 MED ORDER — OXYCODONE HCL 5 MG PO TABS
5.0000 mg | ORAL_TABLET | Freq: Once | ORAL | Status: AC | PRN
  Administered 2018-06-20: 5 mg via ORAL

## 2018-06-20 MED ORDER — FENTANYL CITRATE (PF) 100 MCG/2ML IJ SOLN
INTRAMUSCULAR | Status: DC | PRN
  Administered 2018-06-20: 150 ug via INTRAVENOUS
  Administered 2018-06-20: 100 ug via INTRAVENOUS
  Administered 2018-06-20: 50 ug via INTRAVENOUS

## 2018-06-20 MED ORDER — ONDANSETRON HCL 4 MG/2ML IJ SOLN
INTRAMUSCULAR | Status: DC | PRN
  Administered 2018-06-20: 4 mg via INTRAVENOUS

## 2018-06-20 MED ORDER — SUGAMMADEX SODIUM 200 MG/2ML IV SOLN
INTRAVENOUS | Status: DC | PRN
  Administered 2018-06-20: 100 mg via INTRAVENOUS

## 2018-06-20 MED ORDER — PROPOFOL 10 MG/ML IV BOLUS
INTRAVENOUS | Status: AC
  Filled 2018-06-20: qty 20

## 2018-06-20 MED ORDER — PIPERACILLIN-TAZOBACTAM 3.375 G IVPB 30 MIN
3.3750 g | INTRAVENOUS | Status: AC
  Administered 2018-06-20: 3.375 g via INTRAVENOUS
  Filled 2018-06-20: qty 50

## 2018-06-20 MED ORDER — 0.9 % SODIUM CHLORIDE (POUR BTL) OPTIME
TOPICAL | Status: DC | PRN
  Administered 2018-06-20: 1000 mL

## 2018-06-20 MED ORDER — DEXAMETHASONE SODIUM PHOSPHATE 10 MG/ML IJ SOLN
INTRAMUSCULAR | Status: DC | PRN
  Administered 2018-06-20: 4 mg via INTRAVENOUS

## 2018-06-20 MED ORDER — ROCURONIUM BROMIDE 10 MG/ML (PF) SYRINGE
PREFILLED_SYRINGE | INTRAVENOUS | Status: DC | PRN
  Administered 2018-06-20: 50 mg via INTRAVENOUS

## 2018-06-20 MED ORDER — HYDROMORPHONE HCL 1 MG/ML IJ SOLN
0.2500 mg | INTRAMUSCULAR | Status: DC | PRN
  Administered 2018-06-20 (×2): 0.5 mg via INTRAVENOUS

## 2018-06-20 MED ORDER — MIDAZOLAM HCL 2 MG/2ML IJ SOLN
INTRAMUSCULAR | Status: AC
  Filled 2018-06-20: qty 2

## 2018-06-20 MED ORDER — DEXAMETHASONE SODIUM PHOSPHATE 10 MG/ML IJ SOLN
INTRAMUSCULAR | Status: AC
  Filled 2018-06-20: qty 1

## 2018-06-20 MED ORDER — HYDROMORPHONE HCL 1 MG/ML IJ SOLN
INTRAMUSCULAR | Status: AC
  Filled 2018-06-20: qty 1

## 2018-06-20 MED ORDER — MIDAZOLAM HCL 5 MG/5ML IJ SOLN
INTRAMUSCULAR | Status: DC | PRN
  Administered 2018-06-20: 2 mg via INTRAVENOUS

## 2018-06-20 MED ORDER — ONDANSETRON HCL 4 MG/2ML IJ SOLN
INTRAMUSCULAR | Status: AC
  Filled 2018-06-20: qty 2

## 2018-06-20 MED ORDER — ONDANSETRON HCL 4 MG/2ML IJ SOLN: 4.0000 mg | Freq: Four times a day (QID) | INTRAMUSCULAR | Status: DC | PRN

## 2018-06-20 MED ORDER — KETAMINE HCL 10 MG/ML IJ SOLN
INTRAMUSCULAR | Status: DC | PRN
  Administered 2018-06-20: 50 mg via INTRAVENOUS

## 2018-06-20 MED ORDER — LIDOCAINE 2% (20 MG/ML) 5 ML SYRINGE
INTRAMUSCULAR | Status: DC | PRN
  Administered 2018-06-20: 60 mg via INTRAVENOUS

## 2018-06-20 SURGICAL SUPPLY — 50 items
BANDAGE ACE 4X5 VEL STRL LF (GAUZE/BANDAGES/DRESSINGS) ×4 IMPLANT
BANDAGE ACE 6X5 VEL STRL LF (GAUZE/BANDAGES/DRESSINGS) ×2 IMPLANT
BANDAGE ELASTIC 6 VELCRO ST LF (GAUZE/BANDAGES/DRESSINGS) ×6 IMPLANT
BNDG COHESIVE 4X5 TAN STRL (GAUZE/BANDAGES/DRESSINGS) ×4 IMPLANT
BNDG GAUZE ELAST 4 BULKY (GAUZE/BANDAGES/DRESSINGS) ×8 IMPLANT
BNDG GAUZE STRTCH 6 (GAUZE/BANDAGES/DRESSINGS) ×12 IMPLANT
BRUSH SCRUB SURG 4.25 DISP (MISCELLANEOUS) ×8 IMPLANT
COVER SURGICAL LIGHT HANDLE (MISCELLANEOUS) ×8 IMPLANT
DRAPE U-SHAPE 47X51 STRL (DRAPES) ×4 IMPLANT
DRSG ADAPTIC 3X8 NADH LF (GAUZE/BANDAGES/DRESSINGS) ×4 IMPLANT
DRSG MEPITEL 4X7.2 (GAUZE/BANDAGES/DRESSINGS) ×6 IMPLANT
DRSG VAC ATS MED SENSATRAC (GAUZE/BANDAGES/DRESSINGS) ×2 IMPLANT
DRSG VAC ATS SM SENSATRAC (GAUZE/BANDAGES/DRESSINGS) ×2 IMPLANT
DRSG VERSA FOAM LRG 10X15 (GAUZE/BANDAGES/DRESSINGS) ×2 IMPLANT
ELECT REM PT RETURN 9FT ADLT (ELECTROSURGICAL)
ELECTRODE REM PT RTRN 9FT ADLT (ELECTROSURGICAL) IMPLANT
GAUZE SPONGE 4X4 12PLY STRL (GAUZE/BANDAGES/DRESSINGS) ×10 IMPLANT
GLOVE BIO SURGEON STRL SZ7.5 (GLOVE) ×4 IMPLANT
GLOVE BIO SURGEON STRL SZ8 (GLOVE) ×4 IMPLANT
GLOVE BIOGEL PI IND STRL 7.5 (GLOVE) ×2 IMPLANT
GLOVE BIOGEL PI IND STRL 8 (GLOVE) ×2 IMPLANT
GLOVE BIOGEL PI INDICATOR 7.5 (GLOVE) ×2
GLOVE BIOGEL PI INDICATOR 8 (GLOVE) ×2
GOWN STRL REUS W/ TWL LRG LVL3 (GOWN DISPOSABLE) ×4 IMPLANT
GOWN STRL REUS W/ TWL XL LVL3 (GOWN DISPOSABLE) ×2 IMPLANT
GOWN STRL REUS W/TWL LRG LVL3 (GOWN DISPOSABLE) ×8
GOWN STRL REUS W/TWL XL LVL3 (GOWN DISPOSABLE) ×4
HANDPIECE INTERPULSE COAX TIP (DISPOSABLE)
KIT BASIN OR (CUSTOM PROCEDURE TRAY) ×4 IMPLANT
KIT TURNOVER KIT B (KITS) ×4 IMPLANT
MANIFOLD NEPTUNE II (INSTRUMENTS) ×4 IMPLANT
NS IRRIG 1000ML POUR BTL (IV SOLUTION) ×4 IMPLANT
PACK ORTHO EXTREMITY (CUSTOM PROCEDURE TRAY) ×4 IMPLANT
PAD ABD 8X10 STRL (GAUZE/BANDAGES/DRESSINGS) ×4 IMPLANT
PAD ARMBOARD 7.5X6 YLW CONV (MISCELLANEOUS) ×8 IMPLANT
PAD CAST 4YDX4 CTTN HI CHSV (CAST SUPPLIES) IMPLANT
PADDING CAST COTTON 4X4 STRL (CAST SUPPLIES) ×8
PADDING CAST COTTON 6X4 STRL (CAST SUPPLIES) ×6 IMPLANT
SET HNDPC FAN SPRY TIP SCT (DISPOSABLE) IMPLANT
SPONGE LAP 18X18 X RAY DECT (DISPOSABLE) ×4 IMPLANT
STOCKINETTE IMPERVIOUS 9X36 MD (GAUZE/BANDAGES/DRESSINGS) ×4 IMPLANT
SUT PDS AB 2-0 CT1 27 (SUTURE) IMPLANT
SWAB CULTURE ESWAB REG 1ML (MISCELLANEOUS) IMPLANT
TOWEL OR 17X24 6PK STRL BLUE (TOWEL DISPOSABLE) ×4 IMPLANT
TOWEL OR 17X26 10 PK STRL BLUE (TOWEL DISPOSABLE) ×8 IMPLANT
TUBE CONNECTING 12'X1/4 (SUCTIONS) ×1
TUBE CONNECTING 12X1/4 (SUCTIONS) ×3 IMPLANT
UNDERPAD 30X30 (UNDERPADS AND DIAPERS) ×4 IMPLANT
WATER STERILE IRR 1000ML POUR (IV SOLUTION) ×4 IMPLANT
YANKAUER SUCT BULB TIP NO VENT (SUCTIONS) ×4 IMPLANT

## 2018-06-20 NOTE — Anesthesia Procedure Notes (Signed)
Procedure Name: Intubation Date/Time: 06/20/2018 9:01 AM Performed by: Moshe Salisbury, CRNA Pre-anesthesia Checklist: Patient identified, Emergency Drugs available, Suction available and Patient being monitored Patient Re-evaluated:Patient Re-evaluated prior to induction Oxygen Delivery Method: Circle System Utilized Preoxygenation: Pre-oxygenation with 100% oxygen Induction Type: IV induction Ventilation: Mask ventilation without difficulty Laryngoscope Size: Mac and 4 Grade View: Grade II Tube type: Oral Tube size: 8.5 mm Number of attempts: 1 Airway Equipment and Method: Stylet Placement Confirmation: ETT inserted through vocal cords under direct vision,  positive ETCO2 and breath sounds checked- equal and bilateral Secured at: 22 cm Tube secured with: Tape Dental Injury: Teeth and Oropharynx as per pre-operative assessment

## 2018-06-20 NOTE — Progress Notes (Signed)
Pt arrived to room 5N20 via bed accompanied by family. Received report from [place], RN from 4N. See assessment. Will continue to monitor.

## 2018-06-20 NOTE — Progress Notes (Signed)
OT Cancellation Note  Patient Details Name: Noah Duke MRN: [id number] DOB: June 09, 1991   Cancelled Treatment:    Reason Eval/Treat Not Completed: Patient at procedure or test/ unavailable(in OR)  Evern Bio Travonta Gill 06/20/2018, 8:05 AM  Sherryl Manges OTR/L [phone]

## 2018-06-20 NOTE — Anesthesia Postprocedure Evaluation (Signed)
Anesthesia Post Note  Patient: NABOR THOMANN  Procedure(s) Performed: IRRIGATION AND DEBRIDEMENT EXTREMITY R tibia (Right ) DRESSING CHANGE UNDER ANESTHESIA LEFT LEG (Left Leg Lower)     Patient location during evaluation: PACU Anesthesia Type: General Level of consciousness: awake and alert Pain management: pain level controlled Vital Signs Assessment: post-procedure vital signs reviewed and stable Respiratory status: spontaneous breathing, nonlabored ventilation, respiratory function stable and patient connected to nasal cannula oxygen Cardiovascular status: blood pressure returned to baseline and stable Postop Assessment: no apparent nausea or vomiting Anesthetic complications: no    Last Vitals:  Vitals:   06/20/18 1053 06/20/18 1131  BP: (!) 155/93 (!) 147/89  Pulse: 78 80  Resp: (!) 22 16  Temp: (!) 36.3 C 36.8 C  SpO2: 100% 98%    Last Pain:  Vitals:   06/20/18 1230  TempSrc:   PainSc: 10-Worst pain ever                 Wille Aubuchon S

## 2018-06-20 NOTE — Progress Notes (Signed)
Spoke to pharmacy about rescheduling 0600 zosyn dose to a later time.

## 2018-06-20 NOTE — Transfer of Care (Signed)
Immediate Anesthesia Transfer of Care Note  Patient: Noah Duke  Procedure(s) Performed: IRRIGATION AND DEBRIDEMENT EXTREMITY R tibia (Right ) DRESSING CHANGE UNDER ANESTHESIA LEFT LEG (Left Leg Lower)  Patient Location: PACU  Anesthesia Type:General  Level of Consciousness: awake and patient cooperative  Airway & Oxygen Therapy: Patient Spontanous Breathing and Patient connected to nasal cannula oxygen  Post-op Assessment: Report given to RN, Post -op Vital signs reviewed and stable and Patient moving all extremities  Post vital signs: Reviewed and stable  Last Vitals:  Vitals Value Taken Time  BP 159/89 06/20/2018 10:10 AM  Temp    Pulse 109 06/20/2018 10:11 AM  Resp 27 06/20/2018 10:11 AM  SpO2 100 % 06/20/2018 10:11 AM  Vitals shown include unvalidated device data.  Last Pain:  Vitals:   06/20/18 0430  TempSrc:   PainSc: 6          Complications: No apparent anesthesia complications

## 2018-06-20 NOTE — Progress Notes (Signed)
PT Cancellation Note  Patient Details Name: Noah Duke MRN: [id number] DOB: Sep 12, 1991   Cancelled Treatment:    Reason Eval/Treat Not Completed: Patient at procedure or test/unavailable.  In OR presently, may not be able to evaluate this pt today.  Will see him when able. 06/20/2018  [place] Bing, PT [phone]  (pager)   Eliseo Gum [place] Chahal 06/20/2018, 9:47 AM
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
## 2018-06-20 NOTE — Progress Notes (Signed)
I discussed with the patient the risks and benefits of surgery, including the possibility of infection, nerve injury, vessel injury, failure of closure and  Need for further surgery including muscle flap, DVT/ PE, loss of motion, and others. Again, we also specifically discussed the need to possibly stage surgery because of the elevated risk of soft tissue breakdown that could lead to amputation.  He acknowledged these risks and wished to proceed.  Myrene Galas, MD [facility], [facility] [phone]

## 2018-06-20 NOTE — Anesthesia Preprocedure Evaluation (Addendum)
Anesthesia Evaluation  Patient identified by MRN, date of birth, ID band Patient awake    Reviewed: Allergy & Precautions, H&P , NPO status , Patient's Chart, lab work & pertinent test results  Airway Mallampati: II  TM Distance: >3 FB Neck ROM: full    Dental  (+) Dental Advisory Given, Teeth Intact   Pulmonary neg pulmonary ROS,    breath sounds clear to auscultation       Cardiovascular negative cardio ROS   Rhythm:regular Rate:Normal     Neuro/Psych    GI/Hepatic   Endo/Other    Renal/GU      Musculoskeletal   Abdominal   Peds  Hematology  (+) anemia ,   Anesthesia Other Findings   Reproductive/Obstetrics                            Anesthesia Physical Anesthesia Plan  ASA: II  Anesthesia Plan: General   Post-op Pain Management:    Induction: Intravenous  PONV Risk Score and Plan: 2 and Ondansetron, Dexamethasone, Midazolam and Treatment may vary due to age or medical condition  Airway Management Planned: Oral ETT  Additional Equipment: None  Intra-op Plan:   Post-operative Plan: Extubation in OR  Informed Consent: I have reviewed the patients History and Physical, chart, labs and discussed the procedure including the risks, benefits and alternatives for the proposed anesthesia with the patient or authorized representative who has indicated his/her understanding and acceptance.   Dental advisory given  Plan Discussed with: CRNA, Anesthesiologist and Surgeon  Anesthesia Plan Comments:        Anesthesia Quick Evaluation

## 2018-06-20 NOTE — Plan of Care (Signed)
  Problem: Education: Goal: Knowledge of General Education information will improve Description: Including pain rating scale, medication(s)/side effects and non-pharmacologic comfort measures Outcome: Progressing   Problem: Clinical Measurements: Goal: Ability to maintain clinical measurements within normal limits will improve Outcome: Progressing   Problem: Activity: Goal: Risk for activity intolerance will decrease Outcome: Progressing   Problem: Coping: Goal: Level of anxiety will decrease Outcome: Progressing   Problem: Elimination: Goal: Will not experience complications related to bowel motility Outcome: Progressing   Problem: Safety: Goal: Ability to remain free from injury will improve Outcome: Progressing   
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
## 2018-06-21 ENCOUNTER — Encounter (HOSPITAL_COMMUNITY): Payer: Self-pay | Admitting: Orthopedic Surgery

## 2018-06-21 LAB — TYPE AND SCREEN
ABO/RH(D): A POS
Antibody Screen: NEGATIVE
UNIT DIVISION: 0
Unit division: 0

## 2018-06-21 LAB — BPAM RBC
BLOOD PRODUCT EXPIRATION DATE: 201909112359
Blood Product Expiration Date: 201909112359
ISSUE DATE / TIME: 201908212228
ISSUE DATE / TIME: 201908220052
UNIT TYPE AND RH: 6200
Unit Type and Rh: 6200

## 2018-06-21 LAB — CBC
HEMATOCRIT: 29.7 % — AB (ref 39.0–52.0)
Hemoglobin: 9.9 g/dL — ABNORMAL LOW (ref 13.0–17.0)
MCH: 31 pg (ref 26.0–34.0)
MCHC: 33.3 g/dL (ref 30.0–36.0)
MCV: 93.1 fL (ref 78.0–100.0)
Platelets: 359 10*3/uL (ref 150–400)
RBC: 3.19 MIL/uL — ABNORMAL LOW (ref 4.22–5.81)
RDW: 14.9 % (ref 11.5–15.5)
WBC: 8.6 10*3/uL (ref 4.0–10.5)

## 2018-06-21 LAB — GLUCOSE, CAPILLARY
GLUCOSE-CAPILLARY: 102 mg/dL — AB (ref 70–99)
GLUCOSE-CAPILLARY: 92 mg/dL (ref 70–99)
Glucose-Capillary: 103 mg/dL — ABNORMAL HIGH (ref 70–99)
Glucose-Capillary: 113 mg/dL — ABNORMAL HIGH (ref 70–99)
Glucose-Capillary: 90 mg/dL (ref 70–99)
Glucose-Capillary: 92 mg/dL (ref 70–99)

## 2018-06-21 MED ORDER — OXYCODONE HCL 5 MG PO TABS: 15.0000 mg | ORAL_TABLET | ORAL | Status: DC | PRN

## 2018-06-21 MED ORDER — ENOXAPARIN (LOVENOX) PATIENT EDUCATION KIT: 1.0000 | PACK | Freq: Once | 0 refills | Status: AC

## 2018-06-21 MED ORDER — ENOXAPARIN (LOVENOX) PATIENT EDUCATION KIT
PACK | Freq: Once | Status: DC
  Filled 2018-06-21: qty 1

## 2018-06-21 MED ORDER — OXYCODONE HCL 5 MG PO TABS
5.0000 mg | ORAL_TABLET | ORAL | Status: DC | PRN
  Administered 2018-06-21 – 2018-06-22 (×5): 10 mg via ORAL
  Filled 2018-06-21 (×6): qty 2

## 2018-06-21 MED ORDER — OXYCODONE-ACETAMINOPHEN 7.5-325 MG PO TABS
1.0000 | ORAL_TABLET | Freq: Four times a day (QID) | ORAL | Status: DC | PRN
  Administered 2018-06-21 – 2018-06-22 (×2): 2 via ORAL
  Filled 2018-06-21 (×2): qty 2

## 2018-06-21 MED ORDER — GABAPENTIN 600 MG PO TABS
300.0000 mg | ORAL_TABLET | Freq: Three times a day (TID) | ORAL | Status: DC
Start: 2018-06-21 — End: 2018-06-22
  Administered 2018-06-21 – 2018-06-22 (×3): 300 mg via ORAL
  Filled 2018-06-21 (×3): qty 1

## 2018-06-21 MED ORDER — ENOXAPARIN SODIUM 40 MG/0.4ML ~~LOC~~ SOLN: 40.0000 mg | SUBCUTANEOUS | 0 refills | Status: DC
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
## 2018-06-21 NOTE — Progress Notes (Signed)
1 Day Post-Op   Subjective/Chief Complaint: Pt with expected pain issues Having BMs this AM   Objective: Vital signs in last 24 hours: Temp:  [97.3 F (36.3 C)-99.1 F (37.3 C)] 98.8 F (37.1 C) (08/23 0358) Pulse Rate:  [62-110] 62 (08/23 0358) Resp:  [16-25] 17 (08/23 0358) BP: (139-159)/(79-95) 154/81 (08/23 0358) SpO2:  [95 %-100 %] 98 % (08/23 0358) Weight:  [95.4 kg] 95.4 kg (08/23 0500) Last BM Date: 06/20/18  Intake/Output from previous day: 08/22 0701 - 08/23 0700 In: 1837.7 [P.O.:480; I.V.:1132.7; IV Piggyback:150] Out: 2315 [Urine:2150; Drains:135; Blood:30] Intake/Output this shift: Total I/O In: -  Out: 2000 [Urine:2000]  Constitutional: No acute distress, conversant, appears states age. Eyes: Anicteric sclerae, moist conjunctiva, no lid lag Lungs: Clear to auscultation bilaterally, normal respiratory effort CV: regular rate and rhythm, no murmurs, no peripheral edema, pedal pulses 2+ GI: Soft, no masses or hepatosplenomegaly, non-tender to palpation Skin: No rashes, palpation reveals normal turgor Ext: BLE in splints Psychiatric: appropriate judgment and insight, oriented to person, place, and time   Lab Results:  Recent Labs    06/20/18 0542 06/21/18 0449  WBC 7.7 8.6  HGB 10.0* 9.9*  HCT 29.7* 29.7*  PLT 291 359   BMET Recent Labs    06/19/18 0225 06/20/18 0542  NA 136 138  K 3.3* 3.6  CL 101 103  CO2 26 25  GLUCOSE 106* 105*  BUN 6 7  CREATININE 0.85 0.94  CALCIUM 7.8* 8.1*  Studies/Results: Dg Chest Port 1 View  Result Date: 06/19/2018 CLINICAL DATA:  Fever, cough EXAM: PORTABLE CHEST 1 VIEW COMPARISON:  06/17/2018 FINDINGS: Cardiomegaly with vascular congestion. Patchy opacity in the right lower lung and possibly early infiltrate in the left lung base as well. This is concerning for multifocal pneumonia. No effusions or acute bony abnormality. IMPRESSION: Borderline heart size.  Mild vascular congestion. Bilateral lower lobe airspace  opacities, right greater than left concerning for pneumonia. Asymmetric edema is possible. Electronically Signed   By: Charlett Nose M.D.   On: 06/19/2018 11:26    Anti-infectives: Anti-infectives (From admission, onward)   Start     Dose/Rate Route Frequency Ordered Stop   06/20/18 0730  piperacillin-tazobactam (ZOSYN) IVPB 3.375 g     3.375 g 100 mL/hr over 30 Minutes Intravenous To Surgery 06/20/18 0726 06/20/18 0907   06/18/18 0900  piperacillin-tazobactam (ZOSYN) IVPB 3.375 g     3.375 g 12.5 mL/hr over 240 Minutes Intravenous Every 8 hours 06/18/18 0818     06/17/18 1435  vancomycin (VANCOCIN) powder  Status:  Discontinued       As needed 06/17/18 1435 06/17/18 1651   06/17/18 1434  tobramycin (NEBCIN) powder  Status:  Discontinued       As needed 06/17/18 1434 06/17/18 1651   06/15/18 0200  ceFAZolin (ANCEF) IVPB 1 g/50 mL premix  Status:  Discontinued     1 g 100 mL/hr over 30 Minutes Intravenous Every 8 hours 06/14/18 2218 06/18/18 0817   06/14/18 2030  ceFAZolin (ANCEF) IVPB 1 g/50 mL premix  Status:  Discontinued     1 g 100 mL/hr over 30 Minutes Intravenous  Once 06/14/18 2117 06/14/18 2136   06/14/18 1715  ceFAZolin (ANCEF) IVPB 2g/100 mL premix     2 g 200 mL/hr over 30 Minutes Intravenous  Once 06/14/18 1707 06/14/18 1816      Assessment/Plan: [facility] L femur FX- S/P Ex fix by Dr. Roda Shutters 8/16, per ortho, S/P IM nail by Dr. Carola Frost  8/18 R open tib fib FX- S/P I7D, VAC, Ex fix bu Dr. Roda Shutters 8/6, S/P ORIF by Dr. Carola Frost 8/20. S/P I&D 8/22 by Dr. Carola Frost with Face Time intra-op consult by [facility] plastics. He will need a flap. He will be seen by them in there office next Tuesday with surgery next Thursday. L tib fib FX- S/P Ex fix by Dr. Roda Shutters 8/16, S/P IM nail by Dr. Carola Frost 8/20 Cystic liver lesion Acute hypercarbic ventilator dependent resp failure- extubated 8/20, doing well AKI- resolved ABL anemia  ID- open FX, fever on Ancef 8/16>>8/20, Zosyn 8/20>>, blood CXs P, sputum CX  P FEN- soft diet, IVF VTE- Lovenox Dispo- Transition to home next day or two with further plan as above.  Family concerned about his pain control and having to build ramp to get him into home prior to DC   LOS: 7 days    Marigene Ehlers., Jed Limerick 06/21/2018

## 2018-06-21 NOTE — Plan of Care (Signed)
  Problem: Health Behavior/Discharge Planning: Goal: Ability to manage health-related needs will improve Outcome: Progressing   Problem: Activity: Goal: Risk for activity intolerance will decrease Outcome: Progressing   Problem: Nutrition: Goal: Adequate nutrition will be maintained Outcome: Progressing   Problem: Coping: Goal: Level of anxiety will decrease Outcome: Progressing   Problem: Elimination: Goal: Will not experience complications related to bowel motility Outcome: Progressing   Problem: Pain Managment: Goal: General experience of comfort will improve Outcome: Progressing   Problem: Safety: Goal: Ability to remain free from injury will improve Outcome: Progressing   

## 2018-06-21 NOTE — Discharge Instructions (Addendum)
Orthopaedic Trauma Service Discharge Instructions   General Discharge Instructions  WEIGHT BEARING STATUS: Non-weightbearing bilateral lower extremities  RANGE OF MOTION/ACTIVITY: Unrestricted range of motion at all lower extremity joints.  Do not let knees rest partially bent.  either keep them fully straight or bent to 90 degrees. Put pillows under ankles to keep knees straight while at rest   Stretch heel cords/calf throughout the day.  Recommend wearing gray PRAFO boots at night to keep ankles in a neutral position  Wound Care: Daily wound care to left leg starting on 06/23/2018.  If there is no drainage you can leave wounds open to air.  If there is some drainage present see instructions below.  Leave dressing on right leg until you see Dr. Genelle Gather on 06/25/2018.  It is okay to remove the Ace wrap on the right leg if needed for comfort and to give your skin some relief as well.  Recommend wearing Ace wrap as much as possible to help control swelling.  DVT/PE prophylaxis: Lovenox 40 mg subcutaneous injection daily for the next 30 days  Diet: as you were eating previously.  Can use over the counter stool softeners and bowel preparations, such as Miralax, to help with bowel movements.  Narcotics can be constipating.  Be sure to drink plenty of fluids   Pain medications      Percocet 7.5/325: 1-2 pills every 6 hours as needed for moderate to severe pain       Oxycodone 5 mg: 1-2 pills every 3 hours as needed for breakthrough pain. To use this medication between percocet doses.  Only use if absolutely necessary. No refills for this medication.                        Example: you took percocet at 8:00 am, next percocet dose would be at 2:00 pm if needed.  If you had severe pain in between percocet doses you could take oxycodone at 11 am to address this breakthrough pain.       Robaxin 500 mg (methocarbamol): 1-2 pills every 8 hours scheduled for muscle spasms       Gabapentin 300 mg: 1 pill  every 8 hours x 2 weeks      Tylenol 500 mg: 1 pill every 12 hours scheduled        Continue to use Ice and elevate extremity as this helps with pain control.        Being active and participating in therapy is also great for pain control even though this may seem counter-intuitive    PAIN MEDICATION USE AND EXPECTATIONS  You have likely been given narcotic medications to help control your pain.  After a traumatic event that results in an fracture (broken bone) with or without surgery, it is ok to use narcotic pain medications to help control one's pain.  We understand that everyone responds to pain differently and each individual patient will be evaluated on a regular basis for the continued need for narcotic medications. Ideally, narcotic medication use should last no more than 6-8 weeks (coinciding with fracture healing).   As a patient it is your responsibility as well to monitor narcotic medication use and report the amount and frequency you use these medications when you come to your office visit.   We would also advise that if you are using narcotic medications, you should take a dose prior to therapy to maximize you participation.  IF YOU ARE ON NARCOTIC MEDICATIONS IT  IS NOT PERMISSIBLE TO OPERATE A MOTOR VEHICLE (MOTORCYCLE/CAR/TRUCK/MOPED) OR HEAVY MACHINERY DO NOT MIX NARCOTICS WITH OTHER CNS (CENTRAL NERVOUS SYSTEM) DEPRESSANTS SUCH AS ALCOHOL   STOP SMOKING OR USING NICOTINE PRODUCTS!!!!  As discussed nicotine severely impairs your body's ability to heal surgical and traumatic wounds but also impairs bone healing.  Wounds and bone heal by forming microscopic blood vessels (angiogenesis) and nicotine is a vasoconstrictor (essentially, shrinks blood vessels).  Therefore, if vasoconstriction occurs to these microscopic blood vessels they essentially disappear and are unable to deliver necessary nutrients to the healing tissue.  This is one modifiable factor that you can do to dramatically  increase your chances of healing your injury.    (This means no smoking, no nicotine gum, patches, etc)  DO NOT USE NONSTEROIDAL ANTI-INFLAMMATORY DRUGS (NSAID'S)  Using products such as Advil (ibuprofen), Aleve (naproxen), Motrin (ibuprofen) for additional pain control during fracture healing can delay and/or prevent the healing response.  If you would like to take over the counter (OTC) medication, Tylenol (acetaminophen) is ok.  However, some narcotic medications that are given for pain control contain acetaminophen as well. Therefore, you should not exceed more than 4000 mg of tylenol in a day if you do not have liver disease.  Also note that there are may OTC medicines, such as cold medicines and allergy medicines that my contain tylenol as well.  If you have any questions about medications and/or interactions please ask your doctor/PA or your pharmacist.      ICE AND ELEVATE INJURED/OPERATIVE EXTREMITY  Using ice and elevating the injured extremity above your heart can help with swelling and pain control.  Icing in a pulsatile fashion, such as 20 minutes on and 20 minutes off, can be followed.    Do not place ice directly on skin. Make sure there is a barrier between to skin and the ice pack.    Using frozen items such as frozen peas works well as the conform nicely to the are that needs to be iced.  USE AN ACE WRAP OR TED HOSE FOR SWELLING CONTROL  In addition to icing and elevation, Ace wraps or TED hose are used to help limit and resolve swelling.  It is recommended to use Ace wraps or TED hose until you are informed to stop.    When using Ace Wraps start the wrapping distally (farthest away from the body) and wrap proximally (closer to the body)   Example: If you had surgery on your leg or thing and you do not have a splint on, start the ace wrap at the toes and work your way up to the thigh        If you had surgery on your upper extremity and do not have a splint on, start the ace wrap at  your fingers and work your way up to the upper arm  IF YOU ARE IN A SPLINT OR CAST DO NOT REMOVE IT FOR ANY REASON   If your splint gets wet for any reason please contact the office immediately. You may shower in your splint or cast as long as you keep it dry.  This can be done by wrapping in a cast cover or garbage back (or similar)  Do Not stick any thing down your splint or cast such as pencils, money, or hangers to try and scratch yourself with.  If you feel itchy take benadryl as prescribed on the bottle for itching  IF YOU ARE IN A CAM BOOT (BLACK  BOOT)  You may remove boot periodically. Perform daily dressing changes as noted below.  Wash the liner of the boot regularly and wear a sock when wearing the boot. It is recommended that you sleep in the boot until told otherwise  CALL THE OFFICE WITH ANY QUESTIONS OR CONCERNS: [phone]     Enoxaparin injection What is this medicine? ENOXAPARIN (ee nox a PA rin) is used after knee, hip, or abdominal surgeries to prevent blood clotting. It is also used to treat existing blood clots in the lungs or in the veins. This medicine may be used for other purposes; ask your health care provider or pharmacist if you have questions. COMMON BRAND NAME(S): Lovenox What should I tell my health care provider before I take this medicine? They need to know if you have any of these conditions: -bleeding disorders, hemorrhage, or hemophilia -infection of the heart or heart valves -kidney or liver disease -previous stroke -prosthetic heart valve -recent surgery or delivery of a baby -ulcer in the stomach or intestine, diverticulitis, or other bowel disease -an unusual or allergic reaction to enoxaparin, heparin, pork or pork products, other medicines, foods, dyes, or preservatives -pregnant or trying to get pregnant -breast-feeding How should I use this medicine? This medicine is for injection under the skin. It is usually given by a health-care  professional. You or a family member may be trained on how to give the injections. If you are to give yourself injections, make sure you understand how to use the syringe, measure the dose if necessary, and give the injection. To avoid bruising, do not rub the site where this medicine has been injected. Do not take your medicine more often than directed. Do not stop taking except on the advice of your doctor or health care professional. Make sure you receive a puncture-resistant container to dispose of the needles and syringes once you have finished with them. Do not reuse these items. Return the container to your doctor or health care professional for proper disposal. Talk to your pediatrician regarding the use of this medicine in children. Special care may be needed. Overdosage: If you think you have taken too much of this medicine contact a poison control center or emergency room at once. NOTE: This medicine is only for you. Do not share this medicine with others. What if I miss a dose? If you miss a dose, take it as soon as you can. If it is almost time for your next dose, take only that dose. Do not take double or extra doses. What may interact with this medicine? -aspirin and aspirin-like medicines -certain medicines that treat or prevent blood clots -dipyridamole -NSAIDs, medicines for pain and inflammation, like ibuprofen or naproxen This list may not describe all possible interactions. Give your health care provider a list of all the medicines, herbs, non-prescription drugs, or dietary supplements you use. Also tell them if you smoke, drink alcohol, or use illegal drugs. Some items may interact with your medicine. What should I watch for while using this medicine? Visit your doctor or health care professional for regular checks on your progress. Your condition will be monitored carefully while you are receiving this medicine. Notify your doctor or health care professional and seek emergency  treatment if you develop breathing problems; changes in vision; chest pain; severe, sudden headache; pain, swelling, warmth in the leg; trouble speaking; sudden numbness or weakness of the face, arm, or leg. These can be signs that your condition has gotten worse. If you  are going to have surgery, tell your doctor or health care professional that you are taking this medicine. Do not stop taking this medicine without first talking to your doctor. Be sure to refill your prescription before you run out of medicine. Avoid sports and activities that might cause injury while you are using this medicine. Severe falls or injuries can cause unseen bleeding. Be careful when using sharp tools or knives. Consider using an Neurosurgeon. Take special care brushing or flossing your teeth. Report any injuries, bruising, or red spots on the skin to your doctor or health care professional. What side effects may I notice from receiving this medicine? Side effects that you should report to your doctor or health care professional as soon as possible: -allergic reactions like skin rash, itching or hives, swelling of the face, lips, or tongue -feeling faint or lightheaded, falls -signs and symptoms of bleeding such as bloody or black, tarry stools; red or dark-brown urine; spitting up blood or brown material that looks like coffee grounds; red spots on the skin; unusual bruising or bleeding from the eye, gums, or nose Side effects that usually do not require medical attention (report to your doctor or health care professional if they continue or are bothersome): -pain, redness, or irritation at site where injected This list may not describe all possible side effects. Call your doctor for medical advice about side effects. You may report side effects to FDA at 1-800-FDA-1088. Where should I keep my medicine? Keep out of the reach of children. Store at room temperature between 15 and 30 degrees C (59 and 86 degrees F). Do  not freeze. If your injections have been specially prepared, you may need to store them in the refrigerator. Ask your pharmacist. Throw away any unused medicine after the expiration date. NOTE: This sheet is a summary. It may not cover all possible information. If you have questions about this medicine, talk to your doctor, pharmacist, or health care provider.  2018 Elsevier/Gold Standard (2014-02-17 16:06:21)   1. PAIN CONTROL:  1. Pain is best controlled by a usual combination of three different methods TOGETHER:  1. Ice/Heat 2. Over the counter pain medication 3. Prescription pain medication 2. Most patients will experience some swelling and bruising around wounds. Ice packs or heating pads (30-60 minutes up to 6 times a day) will help. Use ice for the first few days to help decrease swelling and bruising, then switch to heat to help relax tight/sore spots and speed recovery. Some people prefer to use ice alone, heat alone, alternating between ice & heat. Experiment to what works for you. Swelling and bruising can take several weeks to resolve.  3. It is helpful to take an over-the-counter pain medication regularly for the first few weeks. Choose one of the following that works best for you:  1. Naproxen (Aleve, etc) Two 220mg  tabs twice a day 2. Ibuprofen (Advil, etc) Three 200mg  tabs four times a day (every meal & bedtime) 3. Acetaminophen (Tylenol, etc) 500-650mg  four times a day (every meal & bedtime) 4. A prescription for pain medication (such as oxycodone, hydrocodone, etc) should be given to you upon discharge. Take your pain medication as prescribed.  1. If you are having problems/concerns with the prescription medicine (does not control pain, nausea, vomiting, rash, itching, etc), please call us [phone] to see if we need to switch you to a different pain medicine that will work better for you and/or control your side effect better. 2. If you  need a refill on your pain medication,  please contact your pharmacy. They will contact our office to request authorization. Prescriptions will not be filled after 5 pm or on week-ends. 4. Avoid getting constipated. When taking pain medications, it is common to experience some constipation. Increasing fluid intake and taking a fiber supplement (such as Metamucil, Citrucel, FiberCon, MiraLax, etc) 1-2 times a day regularly will usually help prevent this problem from occurring. A mild laxative (prune juice, Milk of Magnesia, MiraLax, etc) should be taken according to package directions if there are no bowel movements after 48 hours.  5. Watch out for diarrhea. If you have many loose bowel movements, simplify your diet to bland foods & liquids for a few days. Stop any stool softeners and decrease your fiber supplement. Switching to mild anti-diarrheal medications (Kayopectate, Pepto Bismol) can help. If this worsens or does not improve, please call us.   WHEN TO CALL us [phone]:  1. Poor pain control 2. Reactions / problems with new medications (rash/itching, nausea, etc)  3. Fever over 101.5 F (38.5 C) 4. Worsening swelling or bruising 5. Continued bleeding from wounds. 6. Increased pain, redness, or drainage from the wounds which could be signs of infection  The clinic staff is available to answer your questions during regular business hours (8:30am-5pm). Please dont hesitate to call and ask to speak to one of our nurses for clinical concerns.  If you have a medical emergency, go to the nearest emergency room or call 911.  A surgeon from [facility] is always on call at the [facility], [place]  [address], [place], [place] [address] ?  MAIN: [phone] ? TOLL FREE: [phone] ?  FAX [phone]  [email or web address]
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
## 2018-06-22 LAB — CULTURE, RESPIRATORY
CULTURE: NORMAL
SPECIAL REQUESTS: NORMAL

## 2018-06-22 LAB — CULTURE, BLOOD (ROUTINE X 2)
CULTURE: NO GROWTH
CULTURE: NO GROWTH
Special Requests: ADEQUATE
Special Requests: ADEQUATE

## 2018-06-22 LAB — GLUCOSE, CAPILLARY
GLUCOSE-CAPILLARY: 95 mg/dL (ref 70–99)
GLUCOSE-CAPILLARY: 110 mg/dL — AB (ref 70–99)
Glucose-Capillary: 121 mg/dL — ABNORMAL HIGH (ref 70–99)

## 2018-06-22 MED ORDER — GABAPENTIN 800 MG PO TABS
400.0000 mg | ORAL_TABLET | Freq: Three times a day (TID) | ORAL | Status: DC
  Filled 2018-06-22 (×3): qty 0.5

## 2018-06-22 MED ORDER — OXYCODONE HCL 5 MG PO TABS
10.0000 mg | ORAL_TABLET | ORAL | Status: DC | PRN
  Administered 2018-06-22 – 2018-06-24 (×14): 15 mg via ORAL
  Filled 2018-06-22 (×14): qty 3

## 2018-06-22 MED ORDER — HYDROMORPHONE HCL 1 MG/ML IJ SOLN
1.0000 mg | Freq: Four times a day (QID) | INTRAMUSCULAR | Status: DC | PRN
  Administered 2018-06-22 – 2018-06-24 (×7): 1 mg via INTRAVENOUS
  Filled 2018-06-22 (×7): qty 1

## 2018-06-22 MED ORDER — ACETAMINOPHEN 500 MG PO TABS
1000.0000 mg | ORAL_TABLET | Freq: Four times a day (QID) | ORAL | Status: DC
  Administered 2018-06-22 – 2018-06-24 (×9): 1000 mg via ORAL
  Filled 2018-06-22 (×9): qty 2

## 2018-06-22 MED ORDER — TRAMADOL HCL 50 MG PO TABS
50.0000 mg | ORAL_TABLET | Freq: Four times a day (QID) | ORAL | Status: DC | PRN
  Administered 2018-06-22 – 2018-06-23 (×2): 50 mg via ORAL
  Filled 2018-06-22 (×2): qty 1

## 2018-06-22 MED ORDER — GABAPENTIN 400 MG PO CAPS
400.0000 mg | ORAL_CAPSULE | Freq: Three times a day (TID) | ORAL | Status: DC
  Administered 2018-06-22 – 2018-06-24 (×7): 400 mg via ORAL
  Filled 2018-06-22 (×7): qty 1
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
## 2018-06-22 NOTE — Plan of Care (Signed)
  Problem: Clinical Measurements: Goal: Ability to maintain clinical measurements within normal limits will improve Outcome: Progressing   Problem: Skin Integrity: Goal: Demonstration of wound healing without infection will improve Outcome: Progressing   Problem: Activity: Goal: Risk for activity intolerance will decrease Outcome: Progressing   Problem: Nutrition: Goal: Adequate nutrition will be maintained Outcome: Progressing   Problem: Pain Managment: Goal: General experience of comfort will improve Outcome: Progressing

## 2018-06-22 NOTE — Plan of Care (Signed)
  Problem: Coping: Goal: Level of anxiety will decrease Outcome: Progressing   Problem: Pain Managment: Goal: General experience of comfort will improve Outcome: Progressing   Problem: Safety: Goal: Ability to remain free from injury will improve Outcome: Progressing   Problem: Skin Integrity: Goal: Risk for impaired skin integrity will decrease Outcome: Progressing   
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
## 2018-06-23 LAB — TRIGLYCERIDES: TRIGLYCERIDES: 167 mg/dL — AB (ref <150)

## 2018-06-23 MED ORDER — TRAMADOL HCL 50 MG PO TABS
100.0000 mg | ORAL_TABLET | Freq: Four times a day (QID) | ORAL | Status: DC | PRN
  Administered 2018-06-23: 100 mg via ORAL
  Filled 2018-06-23: qty 2

## 2018-06-23 NOTE — Plan of Care (Signed)
  Problem: Nutrition: Goal: Adequate nutrition will be maintained Outcome: Progressing   Problem: Coping: Goal: Level of anxiety will decrease Outcome: Progressing   Problem: Elimination: Goal: Will not experience complications related to bowel motility Outcome: Progressing   Problem: Pain Managment: Goal: General experience of comfort will improve Outcome: Progressing   Problem: Safety: Goal: Ability to remain free from injury will improve Outcome: Progressing   

## 2018-06-23 NOTE — Progress Notes (Signed)
Pt transferred from bed to the wheelchair with mod x1 assist, maint NWB to BLE. Wheeled around the unit this afternoon.
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
## 2018-06-23 NOTE — Progress Notes (Signed)
Physical Therapy  Note  Patient Details Name: Noah [facility] MRN: [id number] DOB: 06/27/1991   Discussed mobility/transfers with Amalia Hailey and his parents, who voiced confidence in their ability to transfer; Showed them a technique for placing a pad under his hips to assist with transfers when he is particularly tired;   At time of session, Thaddeaus politely declined transfer training, telling me his L elbow was hurting considerably; Discussed with Lanora Manis, Trauma PA;   They have arranged for a hospital bed at home -- recommend trapeze bar to help with positioning in the bed and mobility in the bed (especially given Bil LEs are Non weight bearing).  Will follow,   Van Clines, PT  Acute Rehabilitation Services Pager [phone] Office [phone]   Levi Aland 06/23/2018, 10:54 AM
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
## 2018-06-24 MED ORDER — OXYCODONE HCL 10 MG PO TABS: 5.0000 mg | ORAL_TABLET | ORAL | 0 refills | Status: DC | PRN

## 2018-06-24 MED ORDER — OXYCODONE-ACETAMINOPHEN 7.5-325 MG PO TABS: 1.0000 | ORAL_TABLET | Freq: Four times a day (QID) | ORAL | 0 refills | Status: DC | PRN

## 2018-06-24 MED ORDER — GABAPENTIN 300 MG PO CAPS: 300.0000 mg | ORAL_CAPSULE | Freq: Three times a day (TID) | ORAL | 0 refills | Status: DC

## 2018-06-24 MED ORDER — OXYCODONE HCL 5 MG PO TABS: 5.0000 mg | ORAL_TABLET | Freq: Four times a day (QID) | ORAL | 0 refills | Status: DC | PRN

## 2018-06-24 MED ORDER — TRAMADOL HCL 50 MG PO TABS
100.0000 mg | ORAL_TABLET | Freq: Four times a day (QID) | ORAL | Status: DC
  Administered 2018-06-24 (×2): 100 mg via ORAL
  Filled 2018-06-24 (×2): qty 2

## 2018-06-24 MED ORDER — ACETAMINOPHEN 500 MG PO TABS: 500.0000 mg | ORAL_TABLET | Freq: Two times a day (BID) | ORAL | 0 refills | Status: DC

## 2018-06-24 MED ORDER — METHOCARBAMOL 500 MG PO TABS: 1000.0000 mg | ORAL_TABLET | Freq: Three times a day (TID) | ORAL | 0 refills | Status: DC

## 2018-06-24 MED FILL — ENOXAPARIN 40 MG/0.4 ML SYR: 40 | 30 days supply | Qty: 12 | Fill #0

## 2018-06-24 MED FILL — GABAPENTIN 300 MG CAPSULE: 300 | 14 days supply | Qty: 42 | Fill #0

## 2018-06-24 MED FILL — oxyCODONE HCL 5 MG TABS: 5 | 4 days supply | Qty: 30 | Fill #0

## 2018-06-24 MED FILL — METHOCARBAMOL 500 MG TABS: 500 | 13 days supply | Qty: 80 | Fill #0

## 2018-06-24 MED FILL — OXYCODON-ACETAMINOPHEN 7.5-: 7.5-325 | 7 days supply | Qty: 60 | Fill #0
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
## 2018-06-24 NOTE — Progress Notes (Signed)
PT Cancellation Note  Patient Details Name: Noah [facility] MRN: [id number] DOB: 1991/05/28   Cancelled Treatment:    Reason Eval/Treat Not Completed: (P) Fatigue/lethargy limiting ability to participate(Met with mother in hall who reports patient is resting at this time and denies need for further therapy during this hopsitalization.  Educated mother on need for drop arm commode as she reports the home care service sent a standard commode to his house. )  Governor Rooks, PTA pager [place] 06/24/2018, 1:59 PM

## 2018-06-24 NOTE — Progress Notes (Signed)
Discharge instructions completed with pt.  Pt verbalized understanding of the information.  Pt denies chest pain, shortness of breath, dizziness, lightheadedness, and n/v.  Pt's IV discontinued.  Pt waiting for home wound vac to be delivered.  Pt to be discharged once wound vac arrives.

## 2018-06-24 NOTE — Progress Notes (Signed)
OT Cancellation Note  Patient Details Name: Noah [facility] MRN: [id number] DOB: Jun 08, 1991   Cancelled Treatment:    Reason Eval/Treat Not Completed: Fatigue/lethargy limiting ability to participate. Per mother, pt sleeping, wants to save his energy for impending discharge later today.   Evern Bio 06/24/2018, 1:59 PM
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
## 2018-06-25 ENCOUNTER — Encounter (HOSPITAL_COMMUNITY): Payer: Self-pay | Admitting: Emergency Medicine

## 2018-07-08 DIAGNOSIS — S72352D Displaced comminuted fracture of shaft of left femur, subsequent encounter for closed fracture with routine healing: Secondary | ICD-10-CM | POA: Diagnosis not present

## 2018-07-08 DIAGNOSIS — S82124D Nondisplaced fracture of lateral condyle of right tibia, subsequent encounter for closed fracture with routine healing: Secondary | ICD-10-CM | POA: Diagnosis not present

## 2018-07-08 DIAGNOSIS — S82252D Displaced comminuted fracture of shaft of left tibia, subsequent encounter for closed fracture with routine healing: Secondary | ICD-10-CM | POA: Diagnosis not present

## 2018-07-08 DIAGNOSIS — S82251F Displaced comminuted fracture of shaft of right tibia, subsequent encounter for open fracture type IIIA, IIIB, or IIIC with routine healing: Secondary | ICD-10-CM | POA: Diagnosis not present

## 2018-07-10 DIAGNOSIS — S81801D Unspecified open wound, right lower leg, subsequent encounter: Secondary | ICD-10-CM | POA: Diagnosis not present

## 2018-07-22 DIAGNOSIS — S82252D Displaced comminuted fracture of shaft of left tibia, subsequent encounter for closed fracture with routine healing: Secondary | ICD-10-CM | POA: Diagnosis not present

## 2018-07-22 DIAGNOSIS — S72352D Displaced comminuted fracture of shaft of left femur, subsequent encounter for closed fracture with routine healing: Secondary | ICD-10-CM | POA: Diagnosis not present

## 2018-07-22 DIAGNOSIS — S82124D Nondisplaced fracture of lateral condyle of right tibia, subsequent encounter for closed fracture with routine healing: Secondary | ICD-10-CM | POA: Diagnosis not present

## 2018-07-22 DIAGNOSIS — S82251F Displaced comminuted fracture of shaft of right tibia, subsequent encounter for open fracture type IIIA, IIIB, or IIIC with routine healing: Secondary | ICD-10-CM | POA: Diagnosis not present

## 2018-07-26 DIAGNOSIS — S81801D Unspecified open wound, right lower leg, subsequent encounter: Secondary | ICD-10-CM | POA: Diagnosis not present
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
## 2018-08-14 DIAGNOSIS — S72352D Displaced comminuted fracture of shaft of left femur, subsequent encounter for closed fracture with routine healing: Secondary | ICD-10-CM | POA: Diagnosis not present

## 2018-08-14 DIAGNOSIS — S82124D Nondisplaced fracture of lateral condyle of right tibia, subsequent encounter for closed fracture with routine healing: Secondary | ICD-10-CM | POA: Diagnosis not present

## 2018-08-14 DIAGNOSIS — S82251F Displaced comminuted fracture of shaft of right tibia, subsequent encounter for open fracture type IIIA, IIIB, or IIIC with routine healing: Secondary | ICD-10-CM | POA: Diagnosis not present

## 2018-08-14 DIAGNOSIS — S82252D Displaced comminuted fracture of shaft of left tibia, subsequent encounter for closed fracture with routine healing: Secondary | ICD-10-CM | POA: Diagnosis not present

## 2018-09-20 ENCOUNTER — Other Ambulatory Visit: Payer: Self-pay | Admitting: Orthopedic Surgery

## 2018-09-23 ENCOUNTER — Other Ambulatory Visit: Payer: Self-pay | Admitting: Orthopedic Surgery

## 2018-09-23 DIAGNOSIS — S82252K Displaced comminuted fracture of shaft of left tibia, subsequent encounter for closed fracture with nonunion: Secondary | ICD-10-CM

## 2018-09-24 ENCOUNTER — Ambulatory Visit
Admission: RE | Admit: 2018-09-24 | Discharge: 2018-09-24 | Disposition: A | Discharge status: Home / Self Care | Payer: Commercial Managed Care - HMO | Source: Ambulatory Visit | Attending: Orthopedic Surgery | Admitting: Orthopedic Surgery

## 2018-09-24 DIAGNOSIS — S82252K Displaced comminuted fracture of shaft of left tibia, subsequent encounter for closed fracture with nonunion: Secondary | ICD-10-CM
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
## 2018-09-30 ENCOUNTER — Encounter (HOSPITAL_COMMUNITY): Payer: Self-pay | Admitting: *Deleted

## 2018-09-30 ENCOUNTER — Other Ambulatory Visit: Payer: Self-pay

## 2018-09-30 NOTE — Progress Notes (Signed)
Pt denies SOB, chest pain, and being under the care of a cardiologist. Pt denies having a stress test , echo and cardiac cath. Pt denies having an EKG within the last year. Pt denies recent labs. Pt made aware to stop taking vitamins, fish oil and herbal medications. Do not take any NSAIDs ie: Ibuprofen, Advil, Naproxen (Aleve), Motrin, BC and Goody Powder. Pt verbalized understanding of all pre-op instructions. 

## 2018-10-01 ENCOUNTER — Encounter (HOSPITAL_COMMUNITY)
Admission: RE | Disposition: A | Discharge status: Home / Self Care | Payer: Self-pay | Source: Ambulatory Visit | Attending: Orthopedic Surgery

## 2018-10-01 ENCOUNTER — Inpatient Hospital Stay (HOSPITAL_COMMUNITY): Payer: Medicaid Other | Admitting: Certified Registered Nurse Anesthetist

## 2018-10-01 ENCOUNTER — Ambulatory Visit (HOSPITAL_COMMUNITY)
Admission: RE | Admit: 2018-10-01 | Discharge: 2018-10-03 | Disposition: A | Discharge status: Home / Self Care | Payer: Medicaid Other | Source: Ambulatory Visit | Attending: Orthopedic Surgery | Admitting: Orthopedic Surgery

## 2018-10-01 ENCOUNTER — Inpatient Hospital Stay (HOSPITAL_COMMUNITY): Payer: Medicaid Other

## 2018-10-01 ENCOUNTER — Encounter (HOSPITAL_COMMUNITY): Payer: Self-pay | Admitting: Urology

## 2018-10-01 ENCOUNTER — Ambulatory Visit (HOSPITAL_COMMUNITY): Payer: Medicaid Other

## 2018-10-01 DIAGNOSIS — G8918 Other acute postprocedural pain: Secondary | ICD-10-CM | POA: Diagnosis not present

## 2018-10-01 DIAGNOSIS — K219 Gastro-esophageal reflux disease without esophagitis: Secondary | ICD-10-CM | POA: Insufficient documentation

## 2018-10-01 DIAGNOSIS — Z419 Encounter for procedure for purposes other than remedying health state, unspecified: Secondary | ICD-10-CM

## 2018-10-01 DIAGNOSIS — S82202K Unspecified fracture of shaft of left tibia, subsequent encounter for closed fracture with nonunion: Secondary | ICD-10-CM | POA: Diagnosis not present

## 2018-10-01 DIAGNOSIS — D62 Acute posthemorrhagic anemia: Secondary | ICD-10-CM | POA: Diagnosis not present

## 2018-10-01 DIAGNOSIS — E559 Vitamin D deficiency, unspecified: Secondary | ICD-10-CM | POA: Diagnosis not present

## 2018-10-01 DIAGNOSIS — S82252K Displaced comminuted fracture of shaft of left tibia, subsequent encounter for closed fracture with nonunion: Principal | ICD-10-CM | POA: Insufficient documentation

## 2018-10-01 HISTORY — PX: INTRAMEDULLARY (IM) NAIL INTERTROCHANTERIC: SHX5875

## 2018-10-01 HISTORY — DX: Gastro-esophageal reflux disease without esophagitis: K21.9

## 2018-10-01 HISTORY — DX: Presence of spectacles and contact lenses: Z97.3

## 2018-10-01 HISTORY — DX: Adverse effect of unspecified anesthetic, initial encounter: T41.45XA

## 2018-10-01 HISTORY — DX: Other complications of anesthesia, initial encounter: T88.59XA

## 2018-10-01 LAB — CBC WITH DIFFERENTIAL/PLATELET
Abs Immature Granulocytes: 0 10*3/uL (ref 0.00–0.07)
Basophils Absolute: 0.1 10*3/uL (ref 0.0–0.1)
Basophils Relative: 1 %
Eosinophils Absolute: 0.4 10*3/uL (ref 0.0–0.5)
Eosinophils Relative: 7 %
HCT: 51 % (ref 39.0–52.0)
Hemoglobin: 15.7 g/dL (ref 13.0–17.0)
Immature Granulocytes: 0 %
Lymphocytes Relative: 45 %
Lymphs Abs: 2.7 10*3/uL (ref 0.7–4.0)
MCH: 29.2 pg (ref 26.0–34.0)
MCHC: 30.8 g/dL (ref 30.0–36.0)
MCV: 95 fL (ref 80.0–100.0)
MONO ABS: 0.5 10*3/uL (ref 0.1–1.0)
Monocytes Relative: 9 %
Neutro Abs: 2.2 10*3/uL (ref 1.7–7.7)
Neutrophils Relative %: 38 %
Platelets: 268 10*3/uL (ref 150–400)
RBC: 5.37 MIL/uL (ref 4.22–5.81)
RDW: 12.9 % (ref 11.5–15.5)
WBC: 5.9 10*3/uL (ref 4.0–10.5)
nRBC: 0 % (ref 0.0–0.2)

## 2018-10-01 LAB — TYPE AND SCREEN
ABO/RH(D): A POS
Antibody Screen: NEGATIVE

## 2018-10-01 LAB — COMPREHENSIVE METABOLIC PANEL
ALT: 17 U/L (ref 0–44)
AST: 21 U/L (ref 15–41)
Albumin: 3.9 g/dL (ref 3.5–5.0)
Alkaline Phosphatase: 82 U/L (ref 38–126)
Anion gap: 12 (ref 5–15)
BUN: 9 mg/dL (ref 6–20)
CO2: 21 mmol/L — AB (ref 22–32)
Calcium: 9.3 mg/dL (ref 8.9–10.3)
Chloride: 104 mmol/L (ref 98–111)
Creatinine, Ser: 0.96 mg/dL (ref 0.61–1.24)
GFR calc Af Amer: >60 mL/min (ref >60)
GFR calc non Af Amer: >60 mL/min (ref >60)
GLUCOSE: 86 mg/dL (ref 70–99)
Potassium: 3.9 mmol/L (ref 3.5–5.1)
SODIUM: 137 mmol/L (ref 135–145)
Total Bilirubin: 0.7 mg/dL (ref 0.3–1.2)
Total Protein: 7.4 g/dL (ref 6.5–8.1)

## 2018-10-01 LAB — PROTIME-INR
INR: 0.97
Prothrombin Time: 12.8 seconds (ref 11.4–15.2)

## 2018-10-01 LAB — ABO/RH: ABO/RH(D): A POS

## 2018-10-01 LAB — URINALYSIS, ROUTINE W REFLEX MICROSCOPIC
Bilirubin Urine: NEGATIVE
Glucose, UA: NEGATIVE mg/dL
Hgb urine dipstick: NEGATIVE
Ketones, ur: NEGATIVE mg/dL
LEUKOCYTES UA: NEGATIVE
Nitrite: NEGATIVE
Protein, ur: NEGATIVE mg/dL
Specific Gravity, Urine: 1.015 (ref 1.005–1.030)
pH: 5 (ref 5.0–8.0)

## 2018-10-01 LAB — RAPID URINE DRUG SCREEN, HOSP PERFORMED
Amphetamines: NOT DETECTED
Barbiturates: NOT DETECTED
Benzodiazepines: NOT DETECTED
Cocaine: NOT DETECTED
Opiates: NOT DETECTED
Tetrahydrocannabinol: POSITIVE — AB

## 2018-10-01 LAB — APTT: aPTT: 28 seconds (ref 24–36)

## 2018-10-01 SURGERY — FIXATION, FRACTURE, INTERTROCHANTERIC, WITH INTRAMEDULLARY ROD
Anesthesia: General | Laterality: Left

## 2018-10-01 MED ORDER — 0.9 % SODIUM CHLORIDE (POUR BTL) OPTIME
TOPICAL | Status: DC | PRN
  Administered 2018-10-01: 1000 mL

## 2018-10-01 MED ORDER — DIPHENHYDRAMINE HCL 50 MG/ML IJ SOLN
INTRAMUSCULAR | Status: AC
  Filled 2018-10-01: qty 1

## 2018-10-01 MED ORDER — MIDAZOLAM HCL 2 MG/2ML IJ SOLN
INTRAMUSCULAR | Status: AC
  Filled 2018-10-01: qty 2

## 2018-10-01 MED ORDER — OXYCODONE HCL 5 MG PO TABS
5.0000 mg | ORAL_TABLET | ORAL | Status: DC | PRN
  Administered 2018-10-01 – 2018-10-02 (×2): 10 mg via ORAL
  Administered 2018-10-02: 5 mg via ORAL
  Administered 2018-10-02: 10 mg via ORAL
  Administered 2018-10-03: 5 mg via ORAL
  Filled 2018-10-01: qty 1
  Filled 2018-10-01 (×3): qty 2
  Filled 2018-10-01: qty 1
  Filled 2018-10-01: qty 2

## 2018-10-01 MED ORDER — PROPOFOL 10 MG/ML IV BOLUS
INTRAVENOUS | Status: AC
  Filled 2018-10-01: qty 20

## 2018-10-01 MED ORDER — LIDOCAINE 2% (20 MG/ML) 5 ML SYRINGE
INTRAMUSCULAR | Status: DC | PRN
  Administered 2018-10-01: 100 mg via INTRAVENOUS

## 2018-10-01 MED ORDER — METHOCARBAMOL 1000 MG/10ML IJ SOLN
1000.0000 mg | Freq: Three times a day (TID) | INTRAVENOUS | Status: DC
  Administered 2018-10-02: 1000 mg via INTRAVENOUS
  Filled 2018-10-01 (×8): qty 10

## 2018-10-01 MED ORDER — LIDOCAINE 2% (20 MG/ML) 5 ML SYRINGE
INTRAMUSCULAR | Status: AC
  Filled 2018-10-01: qty 5

## 2018-10-01 MED ORDER — GLYCOPYRROLATE PF 0.2 MG/ML IJ SOSY
PREFILLED_SYRINGE | INTRAMUSCULAR | Status: DC | PRN
  Administered 2018-10-01: .2 mg via INTRAVENOUS

## 2018-10-01 MED ORDER — FENTANYL CITRATE (PF) 100 MCG/2ML IJ SOLN
25.0000 ug | INTRAMUSCULAR | Status: DC | PRN
  Administered 2018-10-01 (×3): 50 ug via INTRAVENOUS

## 2018-10-01 MED ORDER — ROCURONIUM BROMIDE 50 MG/5ML IV SOSY
PREFILLED_SYRINGE | INTRAVENOUS | Status: DC | PRN
  Administered 2018-10-01: 10 mg via INTRAVENOUS
  Administered 2018-10-01 (×2): 20 mg via INTRAVENOUS
  Administered 2018-10-01: 50 mg via INTRAVENOUS

## 2018-10-01 MED ORDER — SODIUM CHLORIDE (PF) 0.9 % IJ SOLN
INTRAMUSCULAR | Status: DC | PRN
  Administered 2018-10-01: 10 mL

## 2018-10-01 MED ORDER — CEFAZOLIN SODIUM-DEXTROSE 2-3 GM-%(50ML) IV SOLR
INTRAVENOUS | Status: DC | PRN
  Administered 2018-10-01: 2 g via INTRAVENOUS

## 2018-10-01 MED ORDER — CHLORHEXIDINE GLUCONATE 4 % EX LIQD: 60.0000 mL | Freq: Once | CUTANEOUS | Status: DC

## 2018-10-01 MED ORDER — FENTANYL CITRATE (PF) 100 MCG/2ML IJ SOLN
INTRAMUSCULAR | Status: DC | PRN
  Administered 2018-10-01: 50 ug via INTRAVENOUS
  Administered 2018-10-01: 100 ug via INTRAVENOUS
  Administered 2018-10-01 (×2): 50 ug via INTRAVENOUS

## 2018-10-01 MED ORDER — PROPOFOL 10 MG/ML IV BOLUS
INTRAVENOUS | Status: DC | PRN
  Administered 2018-10-01: 200 mg via INTRAVENOUS

## 2018-10-01 MED ORDER — ONDANSETRON HCL 4 MG PO TABS: 4.0000 mg | ORAL_TABLET | Freq: Four times a day (QID) | ORAL | Status: DC | PRN

## 2018-10-01 MED ORDER — ENOXAPARIN SODIUM 40 MG/0.4ML ~~LOC~~ SOLN
40.0000 mg | SUBCUTANEOUS | Status: DC
  Administered 2018-10-02 – 2018-10-03 (×2): 40 mg via SUBCUTANEOUS
  Filled 2018-10-01 (×2): qty 0.4

## 2018-10-01 MED ORDER — FENTANYL CITRATE (PF) 100 MCG/2ML IJ SOLN
INTRAMUSCULAR | Status: AC
  Administered 2018-10-01: 50 ug via INTRAVENOUS
  Filled 2018-10-01: qty 2

## 2018-10-01 MED ORDER — MIDAZOLAM HCL 2 MG/2ML IJ SOLN
2.0000 mg | Freq: Once | INTRAMUSCULAR | Status: AC
  Administered 2018-10-01: 2 mg via INTRAVENOUS

## 2018-10-01 MED ORDER — CEFAZOLIN SODIUM-DEXTROSE 1-4 GM/50ML-% IV SOLN
1.0000 g | Freq: Four times a day (QID) | INTRAVENOUS | Status: AC
  Administered 2018-10-01 – 2018-10-02 (×3): 1 g via INTRAVENOUS
  Filled 2018-10-01 (×3): qty 50

## 2018-10-01 MED ORDER — OXYCODONE HCL 5 MG/5ML PO SOLN: 5.0000 mg | Freq: Once | ORAL | Status: AC | PRN

## 2018-10-01 MED ORDER — BUPIVACAINE LIPOSOME 1.3 % IJ SUSP
20.0000 mL | INTRAMUSCULAR | Status: AC
  Administered 2018-10-01: 20 mL
  Filled 2018-10-01: qty 20

## 2018-10-01 MED ORDER — FENTANYL CITRATE (PF) 250 MCG/5ML IJ SOLN
INTRAMUSCULAR | Status: AC
  Filled 2018-10-01: qty 5

## 2018-10-01 MED ORDER — KETAMINE HCL 10 MG/ML IJ SOLN
INTRAMUSCULAR | Status: DC | PRN
  Administered 2018-10-01: 10 mg via INTRAVENOUS
  Administered 2018-10-01: 30 mg via INTRAVENOUS
  Administered 2018-10-01: 10 mg via INTRAVENOUS

## 2018-10-01 MED ORDER — GABAPENTIN 300 MG PO CAPS
300.0000 mg | ORAL_CAPSULE | Freq: Once | ORAL | Status: AC
  Administered 2018-10-01: 300 mg via ORAL

## 2018-10-01 MED ORDER — ACETAMINOPHEN 325 MG PO TABS: 325.0000 mg | ORAL_TABLET | Freq: Four times a day (QID) | ORAL | Status: DC | PRN

## 2018-10-01 MED ORDER — ONDANSETRON HCL 4 MG/2ML IJ SOLN
INTRAMUSCULAR | Status: DC | PRN
  Administered 2018-10-01: 4 mg via INTRAVENOUS

## 2018-10-01 MED ORDER — METOCLOPRAMIDE HCL 5 MG PO TABS: 5.0000 mg | ORAL_TABLET | Freq: Three times a day (TID) | ORAL | Status: DC | PRN

## 2018-10-01 MED ORDER — ONDANSETRON HCL 4 MG/2ML IJ SOLN: 4.0000 mg | Freq: Four times a day (QID) | INTRAMUSCULAR | Status: DC | PRN

## 2018-10-01 MED ORDER — FENTANYL CITRATE (PF) 100 MCG/2ML IJ SOLN
INTRAMUSCULAR | Status: AC
  Filled 2018-10-01: qty 2

## 2018-10-01 MED ORDER — DOCUSATE SODIUM 100 MG PO CAPS
100.0000 mg | ORAL_CAPSULE | Freq: Two times a day (BID) | ORAL | Status: DC
  Administered 2018-10-02 – 2018-10-03 (×3): 100 mg via ORAL
  Filled 2018-10-01 (×4): qty 1

## 2018-10-01 MED ORDER — METHOCARBAMOL 750 MG PO TABS
750.0000 mg | ORAL_TABLET | Freq: Three times a day (TID) | ORAL | Status: DC
  Administered 2018-10-01 – 2018-10-03 (×5): 750 mg via ORAL
  Filled 2018-10-01 (×5): qty 1

## 2018-10-01 MED ORDER — ACETAMINOPHEN 500 MG PO TABS
1000.0000 mg | ORAL_TABLET | Freq: Once | ORAL | Status: AC
  Administered 2018-10-01: 1000 mg via ORAL

## 2018-10-01 MED ORDER — OXYCODONE HCL 5 MG PO TABS
ORAL_TABLET | ORAL | Status: AC
  Filled 2018-10-01: qty 1

## 2018-10-01 MED ORDER — LACTATED RINGERS IV SOLN
INTRAVENOUS | Status: DC
  Administered 2018-10-01 (×2): via INTRAVENOUS

## 2018-10-01 MED ORDER — OXYCODONE HCL 5 MG PO TABS
5.0000 mg | ORAL_TABLET | Freq: Once | ORAL | Status: AC | PRN
  Administered 2018-10-01: 5 mg via ORAL

## 2018-10-01 MED ORDER — MIDAZOLAM HCL 5 MG/5ML IJ SOLN
INTRAMUSCULAR | Status: DC | PRN
  Administered 2018-10-01: 2 mg via INTRAVENOUS

## 2018-10-01 MED ORDER — OXYCODONE-ACETAMINOPHEN 5-325 MG PO TABS
1.0000 | ORAL_TABLET | ORAL | Status: DC | PRN
  Administered 2018-10-01 – 2018-10-02 (×2): 2 via ORAL
  Administered 2018-10-02: 1 via ORAL
  Administered 2018-10-02: 2 via ORAL
  Administered 2018-10-03: 1 via ORAL
  Filled 2018-10-01 (×2): qty 2
  Filled 2018-10-01 (×2): qty 1
  Filled 2018-10-01: qty 2

## 2018-10-01 MED ORDER — ONDANSETRON HCL 4 MG/2ML IJ SOLN: 4.0000 mg | Freq: Once | INTRAMUSCULAR | Status: DC | PRN

## 2018-10-01 MED ORDER — FENTANYL CITRATE (PF) 100 MCG/2ML IJ SOLN
100.0000 ug | Freq: Once | INTRAMUSCULAR | Status: AC
  Administered 2018-10-01: 100 ug via INTRAVENOUS

## 2018-10-01 MED ORDER — POTASSIUM CHLORIDE IN NACL 20-0.9 MEQ/L-% IV SOLN
INTRAVENOUS | Status: DC
  Administered 2018-10-01 – 2018-10-02 (×2): via INTRAVENOUS
  Filled 2018-10-01 (×2): qty 1000

## 2018-10-01 MED ORDER — GABAPENTIN 300 MG PO CAPS
ORAL_CAPSULE | ORAL | Status: AC
  Filled 2018-10-01: qty 1

## 2018-10-01 MED ORDER — MEPERIDINE HCL 50 MG/ML IJ SOLN
INTRAMUSCULAR | Status: AC
  Administered 2018-10-01: 17:00:00
  Filled 2018-10-01: qty 1

## 2018-10-01 MED ORDER — HYDROMORPHONE HCL 1 MG/ML IJ SOLN
0.5000 mg | INTRAMUSCULAR | Status: DC | PRN
  Administered 2018-10-01 – 2018-10-03 (×9): 1 mg via INTRAVENOUS
  Filled 2018-10-01 (×9): qty 1

## 2018-10-01 MED ORDER — SUGAMMADEX SODIUM 200 MG/2ML IV SOLN
INTRAVENOUS | Status: DC | PRN
  Administered 2018-10-01: 163.2 mg via INTRAVENOUS

## 2018-10-01 MED ORDER — ROPIVACAINE HCL 5 MG/ML IJ SOLN
INTRAMUSCULAR | Status: DC | PRN
  Administered 2018-10-01: 15 mL via PERINEURAL
  Administered 2018-10-01: 25 mL via PERINEURAL

## 2018-10-01 MED ORDER — ROCURONIUM BROMIDE 50 MG/5ML IV SOSY
PREFILLED_SYRINGE | INTRAVENOUS | Status: AC
  Filled 2018-10-01: qty 5

## 2018-10-01 MED ORDER — GLYCOPYRROLATE PF 0.2 MG/ML IJ SOSY
PREFILLED_SYRINGE | INTRAMUSCULAR | Status: AC
  Filled 2018-10-01: qty 1

## 2018-10-01 MED ORDER — KETAMINE HCL 50 MG/5ML IJ SOSY
PREFILLED_SYRINGE | INTRAMUSCULAR | Status: AC
  Filled 2018-10-01: qty 5

## 2018-10-01 MED ORDER — ACETAMINOPHEN 500 MG PO TABS
ORAL_TABLET | ORAL | Status: AC
  Filled 2018-10-01: qty 2

## 2018-10-01 MED ORDER — METOCLOPRAMIDE HCL 5 MG/ML IJ SOLN: 5.0000 mg | Freq: Three times a day (TID) | INTRAMUSCULAR | Status: DC | PRN

## 2018-10-01 MED ORDER — DEXAMETHASONE SODIUM PHOSPHATE 4 MG/ML IJ SOLN
INTRAMUSCULAR | Status: DC | PRN
  Administered 2018-10-01: 10 mg via INTRAVENOUS

## 2018-10-01 MED ORDER — MEPERIDINE HCL 50 MG/ML IJ SOLN
6.2500 mg | Freq: Once | INTRAMUSCULAR | Status: AC
  Administered 2018-10-01: 6.25 mg via INTRAVENOUS

## 2018-10-01 SURGICAL SUPPLY — 65 items
BANDAGE ACE 4X5 VEL STRL LF (GAUZE/BANDAGES/DRESSINGS) ×2 IMPLANT
BNDG CMPR MED 10X6 ELC LF (GAUZE/BANDAGES/DRESSINGS) ×1
BNDG COHESIVE 6X5 TAN STRL LF (GAUZE/BANDAGES/DRESSINGS) IMPLANT
BNDG ELASTIC 6X10 VLCR STRL LF (GAUZE/BANDAGES/DRESSINGS) ×2 IMPLANT
BRUSH SCRUB SURG 4.25 DISP (MISCELLANEOUS) ×6 IMPLANT
COVER PERINEAL POST (MISCELLANEOUS) ×1 IMPLANT
COVER SURGICAL LIGHT HANDLE (MISCELLANEOUS) ×4 IMPLANT
COVER WAND RF STERILE (DRAPES) ×1 IMPLANT
DRAPE C-ARMOR (DRAPES) ×3 IMPLANT
DRAPE HALF SHEET 40X57 (DRAPES) ×2 IMPLANT
DRAPE ORTHO SPLIT 77X108 STRL (DRAPES)
DRAPE STERI IOBAN 125X83 (DRAPES) ×3 IMPLANT
DRAPE SURG ORHT 6 SPLT 77X108 (DRAPES) IMPLANT
DRAPE U-SHAPE 47X51 STRL (DRAPES) ×3 IMPLANT
DRSG EMULSION OIL 3X3 NADH (GAUZE/BANDAGES/DRESSINGS) ×1 IMPLANT
DRSG MEPILEX BORDER 4X4 (GAUZE/BANDAGES/DRESSINGS) ×3 IMPLANT
DRSG MEPILEX BORDER 4X8 (GAUZE/BANDAGES/DRESSINGS) ×3 IMPLANT
DRSG MEPITEL 4X7.2 (GAUZE/BANDAGES/DRESSINGS) ×2 IMPLANT
ELECT REM PT RETURN 9FT ADLT (ELECTROSURGICAL) ×3
ELECTRODE REM PT RTRN 9FT ADLT (ELECTROSURGICAL) ×1 IMPLANT
GAUZE SPONGE 4X4 12PLY STRL LF (GAUZE/BANDAGES/DRESSINGS) ×2 IMPLANT
GLOVE BIO SURGEON STRL SZ7.5 (GLOVE) ×3 IMPLANT
GLOVE BIO SURGEON STRL SZ8 (GLOVE) ×3 IMPLANT
GLOVE BIOGEL PI IND STRL 7.5 (GLOVE) ×1 IMPLANT
GLOVE BIOGEL PI IND STRL 8 (GLOVE) ×1 IMPLANT
GLOVE BIOGEL PI INDICATOR 7.5 (GLOVE) ×2
GLOVE BIOGEL PI INDICATOR 8 (GLOVE) ×2
GOWN STRL REUS W/ TWL LRG LVL3 (GOWN DISPOSABLE) ×2 IMPLANT
GOWN STRL REUS W/ TWL XL LVL3 (GOWN DISPOSABLE) ×1 IMPLANT
GOWN STRL REUS W/TWL LRG LVL3 (GOWN DISPOSABLE) ×12
GOWN STRL REUS W/TWL XL LVL3 (GOWN DISPOSABLE) ×3
GUIDEWIRE BALL NOSE 80CM (WIRE) ×2 IMPLANT
KIT BASIN OR (CUSTOM PROCEDURE TRAY) ×3 IMPLANT
KIT TURNOVER KIT B (KITS) ×3 IMPLANT
LINER BOOT UNIVERSAL DISP (MISCELLANEOUS) ×1 IMPLANT
MANIFOLD NEPTUNE II (INSTRUMENTS) ×3 IMPLANT
NAIL TIBIAL 10MMX37.5CM (Nail) ×2 IMPLANT
NS IRRIG 1000ML POUR BTL (IV SOLUTION) ×3 IMPLANT
PACK GENERAL/GYN (CUSTOM PROCEDURE TRAY) ×3 IMPLANT
PAD ABD 8X10 STRL (GAUZE/BANDAGES/DRESSINGS) ×2 IMPLANT
PAD ARMBOARD 7.5X6 YLW CONV (MISCELLANEOUS) ×6 IMPLANT
PAD CAST 3X4 CTTN HI CHSV (CAST SUPPLIES) IMPLANT
PADDING CAST COTTON 3X4 STRL (CAST SUPPLIES) ×3
PADDING CAST SYN 6 (CAST SUPPLIES) ×2
PADDING CAST SYNTHETIC 6X4 NS (CAST SUPPLIES) IMPLANT
SCREW ACECAP 36MM (Screw) ×2 IMPLANT
SCREW ACECAP 46MM (Screw) ×2 IMPLANT
SCREW PROXIMAL DEPUY (Screw) ×6 IMPLANT
SCREW PRXML FT 55X5.5XNS TIB (Screw) IMPLANT
SCREW PRXML FT 65X5.5XNS CORT (Screw) IMPLANT
SPONGE SURGIFOAM ABS GEL SZ50 (HEMOSTASIS) ×2 IMPLANT
STAPLER VISISTAT 35W (STAPLE) ×3 IMPLANT
STOCKINETTE IMPERVIOUS LG (DRAPES) ×2 IMPLANT
SUT ETHILON 2 0 FS 18 (SUTURE) ×6 IMPLANT
SUT ETHILON 3 0 PS 1 (SUTURE) ×3 IMPLANT
SUT PDS AB 2-0 CT1 27 (SUTURE) ×2 IMPLANT
SUT VIC AB 0 CT1 27 (SUTURE) ×6
SUT VIC AB 0 CT1 27XBRD ANBCTR (SUTURE) ×1 IMPLANT
SUT VIC AB 1 CT1 27 (SUTURE) ×3
SUT VIC AB 1 CT1 27XBRD ANBCTR (SUTURE) ×1 IMPLANT
SUT VIC AB 2-0 CT1 27 (SUTURE) ×6
SUT VIC AB 2-0 CT1 TAPERPNT 27 (SUTURE) ×1 IMPLANT
TOWEL OR 17X24 6PK STRL BLUE (TOWEL DISPOSABLE) ×3 IMPLANT
TOWEL OR 17X26 10 PK STRL BLUE (TOWEL DISPOSABLE) ×6 IMPLANT
WATER STERILE IRR 1000ML POUR (IV SOLUTION) ×3 IMPLANT

## 2018-10-01 NOTE — Anesthesia Procedure Notes (Signed)
Procedure Name: Intubation Date/Time: 10/01/2018 10:50 AM Performed by: Mayer Camel, CRNA Pre-anesthesia Checklist: Patient identified, Emergency Drugs available, Suction available, Patient being monitored and Timeout performed Patient Re-evaluated:Patient Re-evaluated prior to induction Oxygen Delivery Method: Circle system utilized Preoxygenation: Pre-oxygenation with 100% oxygen Induction Type: IV induction Ventilation: Mask ventilation without difficulty Laryngoscope Size: Miller and 3 Grade View: Grade I Tube type: Oral Tube size: 7.5 mm Number of attempts: 1 Airway Equipment and Method: Stylet Placement Confirmation: ETT inserted through vocal cords under direct vision,  positive ETCO2,  CO2 detector and breath sounds checked- equal and bilateral Secured at: 23 cm Tube secured with: Tape Dental Injury: Teeth and Oropharynx as per pre-operative assessment
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
## 2018-10-01 NOTE — Anesthesia Preprocedure Evaluation (Signed)
Anesthesia Evaluation  Patient identified by MRN, date of birth, ID band Patient awake    Reviewed: Allergy & Precautions, NPO status , Patient's Chart, lab work & pertinent test results  History of Anesthesia Complications (+) AWARENESS UNDER ANESTHESIA and history of anesthetic complications  Airway Mallampati: II  TM Distance: >3 FB Neck ROM: Full  Mouth opening: Limited Mouth Opening  Dental no notable dental hx.    Pulmonary neg pulmonary ROS,    Pulmonary exam normal        Cardiovascular negative cardio ROS Normal cardiovascular exam     Neuro/Psych negative neurological ROS  negative psych ROS   GI/Hepatic negative GI ROS, Neg liver ROS,   Endo/Other  negative endocrine ROS  Renal/GU negative Renal ROS  negative genitourinary   Musculoskeletal negative musculoskeletal ROS (+)   Abdominal   Peds  Hematology negative hematology ROS (+)   Anesthesia Other Findings   Reproductive/Obstetrics                             Anesthesia Physical Anesthesia Plan  ASA: I  Anesthesia Plan: General   Post-op Pain Management: GA combined w/ Regional for post-op pain   Induction: Intravenous  PONV Risk Score and Plan: 2 and Ondansetron, Dexamethasone, Midazolam and Treatment may vary due to age or medical condition  Airway Management Planned: Oral ETT  Additional Equipment: None  Intra-op Plan:   Post-operative Plan: Extubation in OR  Informed Consent: I have reviewed the patients History and Physical, chart, labs and discussed the procedure including the risks, benefits and alternatives for the proposed anesthesia with the patient or authorized representative who has indicated his/her understanding and acceptance.     Plan Discussed with:   Anesthesia Plan Comments:         Anesthesia Quick Evaluation

## 2018-10-01 NOTE — Plan of Care (Signed)

## 2018-10-01 NOTE — Anesthesia Postprocedure Evaluation (Signed)
Anesthesia Post Note  Patient: Noah Duke  Procedure(s) Performed: EXCHANGE LEFT TIBIAL NAIL (Left )     Patient location during evaluation: PACU Anesthesia Type: General Level of consciousness: awake and alert Pain management: pain level controlled Vital Signs Assessment: post-procedure vital signs reviewed and stable Respiratory status: spontaneous breathing, nonlabored ventilation and respiratory function stable Cardiovascular status: blood pressure returned to baseline and stable Postop Assessment: no apparent nausea or vomiting Anesthetic complications: no    Last Vitals:  Vitals:   10/01/18 1550 10/01/18 1620  BP: 122/73 120/72  Pulse: 64 (!) 57  Resp: 14 13  Temp:    SpO2: 96% 97%    Last Pain:  Vitals:   10/01/18 1620  TempSrc:   PainSc: Asleep                 Lucretia Kern

## 2018-10-01 NOTE — Transfer of Care (Signed)
Immediate Anesthesia Transfer of Care Note  Patient: Noah Duke  Procedure(s) Performed: EXCHANGE LEFT TIBIAL NAIL (Left )  Patient Location: PACU  Anesthesia Type:General  Level of Consciousness: awake, alert  and oriented  Airway & Oxygen Therapy: Patient Spontanous Breathing and Patient connected to nasal cannula oxygen  Post-op Assessment: Report given to RN and Post -op Vital signs reviewed and stable  Post vital signs: Reviewed and stable  Last Vitals:  Vitals Value Taken Time  BP 150/102 10/01/2018  2:19 PM  Temp    Pulse 115 10/01/2018  2:21 PM  Resp 24 10/01/2018  2:21 PM  SpO2 100 % 10/01/2018  2:21 PM  Vitals shown include unvalidated device data.  Last Pain:  Vitals:   10/01/18 0911  TempSrc:   PainSc: 5       Patients Stated Pain Goal: 3 (10/01/18 0911)  Complications: No apparent anesthesia complications

## 2018-10-01 NOTE — Anesthesia Procedure Notes (Signed)
Anesthesia Regional Block: Popliteal block   Pre-Anesthetic Checklist: ,, timeout performed, Correct Patient, Correct Site, Correct Laterality, Correct Procedure, Correct Position, site marked, Risks and benefits discussed,  Surgical consent,  Pre-op evaluation,  At surgeon's request and post-op pain management  Laterality: Left  Prep: chloraprep       Needles:  Injection technique: Single-shot  Needle Type: Echogenic Stimulator Needle     Needle Length: 9cm  Needle Gauge: 21     Additional Needles:   Procedures:,,,, ultrasound used (permanent image in chart),,,,  Narrative:  Start time: 10/01/2018 10:10 AM End time: 10/01/2018 10:13 AM Injection made incrementally with aspirations every 5 mL.  Performed by: Personally  Anesthesiologist: Lucretia Kern, MD  Additional Notes: Monitors applied. Injection made in 5cc increments. No resistance to injection. Good needle visualization. Patient tolerated procedure well.

## 2018-10-01 NOTE — Anesthesia Procedure Notes (Signed)
Anesthesia Regional Block: Adductor canal block   Pre-Anesthetic Checklist: ,, timeout performed, Correct Patient, Correct Site, Correct Laterality, Correct Procedure, Correct Position, site marked, Risks and benefits discussed,  Surgical consent,  Pre-op evaluation,  At surgeon's request and post-op pain management  Laterality: Left  Prep: chloraprep       Needles:  Injection technique: Single-shot  Needle Type: Echogenic Stimulator Needle     Needle Length: 9cm  Needle Gauge: 21     Additional Needles:   Procedures:,,,, ultrasound used (permanent image in chart),,,,  Narrative:  Start time: 10/01/2018 10:05 AM End time: 10/01/2018 10:10 AM Injection made incrementally with aspirations every 5 mL.  Performed by: Personally  Anesthesiologist: Lucretia Kern, MD  Additional Notes: Monitors applied. Injection made in 5cc increments. No resistance to injection. Good needle visualization. Patient tolerated procedure well.

## 2018-10-02 ENCOUNTER — Encounter (HOSPITAL_COMMUNITY): Payer: Self-pay | Admitting: Orthopedic Surgery

## 2018-10-02 ENCOUNTER — Other Ambulatory Visit: Payer: Self-pay

## 2018-10-02 DIAGNOSIS — S82252K Displaced comminuted fracture of shaft of left tibia, subsequent encounter for closed fracture with nonunion: Secondary | ICD-10-CM | POA: Diagnosis not present

## 2018-10-02 LAB — VITAMIN D 25 HYDROXY (VIT D DEFICIENCY, FRACTURES): Vit D, 25-Hydroxy: 14.1 ng/mL — ABNORMAL LOW (ref 30.0–100.0)

## 2018-10-02 LAB — CALCITRIOL (1,25 DI-OH VIT D): Vit D, 1,25-Dihydroxy: 10.6 pg/mL — ABNORMAL LOW (ref 19.9–79.3)

## 2018-10-02 LAB — CBC
HEMATOCRIT: 43 % (ref 39.0–52.0)
Hemoglobin: 13.5 g/dL (ref 13.0–17.0)
MCH: 28.7 pg (ref 26.0–34.0)
MCHC: 31.4 g/dL (ref 30.0–36.0)
MCV: 91.5 fL (ref 80.0–100.0)
Platelets: 290 10*3/uL (ref 150–400)
RBC: 4.7 MIL/uL (ref 4.22–5.81)
RDW: 12.7 % (ref 11.5–15.5)
WBC: 13.1 10*3/uL — ABNORMAL HIGH (ref 4.0–10.5)
nRBC: 0 % (ref 0.0–0.2)

## 2018-10-02 NOTE — Progress Notes (Signed)
Orthopaedic Trauma Service (OTS)  1 Day Post-Op Procedure(s) (LRB): EXCHANGE LEFT TIBIAL NAIL (Left)  Subjective: Patient reports pain as severe and is requiring IV narcotics.    Objective: Current Vitals Blood pressure 134/77, pulse 68, temperature 98.4 F (36.9 C), temperature source Oral, resp. rate 18, height 5\' 11"  (1.803 m), weight 81.6 kg, SpO2 97 %. Vital signs in last 24 hours: Temp:  [97.5 F (36.4 C)-98.4 F (36.9 C)] 98.4 F (36.9 C) (12/04 0048) Pulse Rate:  [57-116] 68 (12/04 0048) Resp:  [13-34] 18 (12/04 0048) BP: (120-158)/(67-102) 134/77 (12/04 0048) SpO2:  [96 %-100 %] 97 % (12/04 0048) Weight:  [81.6 kg] 81.6 kg (12/03 0851)  Intake/Output from previous day: 12/03 0701 - 12/04 0700 In: 1540 [P.O.:240; I.V.:1300] Out: 950 [Urine:800; Blood:150]  LABS Recent Labs    10/01/18 0902 10/02/18 0201  HGB 15.7 13.5   Recent Labs    10/01/18 0902 10/02/18 0201  WBC 5.9 13.1*  RBC 5.37 4.70  HCT 51.0 43.0  PLT 268 290   Recent Labs    10/01/18 0902  NA 137  K 3.9  CL 104  CO2 21*  BUN 9  CREATININE 0.96  GLUCOSE 86  CALCIUM 9.3   Recent Labs    10/01/18 0902  INR 0.97     Physical Exam Left hip dressing intact LLE Dressing intact, clean, dry  Edema/ swelling controlled  Sens: DPN, SPN, TN intact  Motor: EHL, FHL, and lessor toe ext and flex all intact grossly  Brisk cap refill, warm to touch  Assessment/Plan: 1 Day Post-Op Procedure(s) (LRB): EXCHANGE LEFT TIBIAL NAIL (Left) 1. PT/OT with PWB on LLE 2. DVT proph Lovenox 3. D/c tomorrow 4. F/u 8-14 days  Myrene Galas, MD [facility] [phone] (p)

## 2018-10-02 NOTE — Progress Notes (Signed)
The patient has requested pain medication often today.  PRNs  given as ordered.  At times, after administering the medication, the patient will state "I hope this helps me sleep."

## 2018-10-02 NOTE — Plan of Care (Signed)
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
## 2018-10-03 ENCOUNTER — Encounter (HOSPITAL_COMMUNITY): Payer: Self-pay | Admitting: Orthopedic Surgery

## 2018-10-03 DIAGNOSIS — S82252K Displaced comminuted fracture of shaft of left tibia, subsequent encounter for closed fracture with nonunion: Secondary | ICD-10-CM | POA: Diagnosis not present

## 2018-10-03 DIAGNOSIS — K219 Gastro-esophageal reflux disease without esophagitis: Secondary | ICD-10-CM | POA: Diagnosis present

## 2018-10-03 DIAGNOSIS — E559 Vitamin D deficiency, unspecified: Secondary | ICD-10-CM | POA: Diagnosis present

## 2018-10-03 LAB — TESTOSTERONE: Testosterone: 183 ng/dL — ABNORMAL LOW (ref 264–916)

## 2018-10-03 LAB — TESTOSTERONE, FREE: Testosterone, Free: 4 pg/mL — ABNORMAL LOW (ref 9.3–26.5)

## 2018-10-03 LAB — SEX HORMONE BINDING GLOBULIN: Sex Hormone Binding: 27.8 nmol/L (ref 16.5–55.9)

## 2018-10-03 MED ORDER — VITAMIN C 500 MG PO TABS: 500.0000 mg | ORAL_TABLET | Freq: Every day | ORAL | 1 refills | Status: DC

## 2018-10-03 MED ORDER — CALCIUM CARBONATE 1250 (500 CA) MG PO TABS: 1.0000 | ORAL_TABLET | Freq: Two times a day (BID) | ORAL | Status: DC

## 2018-10-03 MED ORDER — CALCIUM CITRATE 950 (200 CA) MG PO TABS: 200.0000 mg | ORAL_TABLET | Freq: Two times a day (BID) | ORAL | Status: DC

## 2018-10-03 MED ORDER — OXYCODONE-ACETAMINOPHEN 10-325 MG PO TABS: 1.0000 | ORAL_TABLET | Freq: Four times a day (QID) | ORAL | 0 refills | Status: DC | PRN

## 2018-10-03 MED ORDER — ENOXAPARIN SODIUM 40 MG/0.4ML ~~LOC~~ SOLN: 40.0000 mg | SUBCUTANEOUS | 0 refills | Status: DC

## 2018-10-03 MED ORDER — METHOCARBAMOL 750 MG PO TABS: 750.0000 mg | ORAL_TABLET | Freq: Three times a day (TID) | ORAL | 0 refills | Status: DC | PRN

## 2018-10-03 MED ORDER — VITAMIN D3 125 MCG (5000 UT) PO CAPS: 1.0000 | ORAL_CAPSULE | Freq: Every day | ORAL | 2 refills | Status: DC

## 2018-10-03 MED ORDER — DOCUSATE SODIUM 100 MG PO CAPS: 100.0000 mg | ORAL_CAPSULE | Freq: Two times a day (BID) | ORAL | 0 refills | Status: DC

## 2018-10-03 NOTE — Care Management Note (Signed)
Case Management Note  Patient Details  Name: Noah Duke MRN: [id number] Date of Birth: [birth year]  Subjective/Objective:   27 yr old male s/p repair of left tibial nonunion with left iliac crest autografting, IM Nailing of left tibia.                  Action/Plan: Case manager spoke with patient and his parents concerning discharge plan. Patient politely declines home health at this time. He has all DME and parents will provide support.   Expected Discharge Date:  10/03/18               Expected Discharge Plan:  Home/Self Care  In-House Referral:  NA  Discharge planning Services  CM Consult  Post Acute Care Choice:  Home Health Choice offered to:  Patient, Parent  DME Arranged:  N/A(Has all necessary DME) DME Agency:  NA  HH Arranged:  PT, Patient Refused HH HH Agency:  NA  Status of Service:  Completed, signed off  If discussed at Long Length of Stay Meetings, dates discussed:    Additional Comments:  Durenda Guthrie, RN 10/03/2018, 11:40 AM

## 2018-10-03 NOTE — Care Management (Signed)
Case manager spoke with patient and his parents concerning discharge needs. Choice for Home Health was offered, patient has politely declined.  He states that he is able to do what he needs at this point. Cm discussed this with therapists as well. Patient has all necessary DME and will have family support at discharge.

## 2018-10-03 NOTE — Discharge Summary (Signed)
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 of testosterone replacement therapy.  I do believe that this would have a long term impact on his overall health and not just his bone health given his young age 37 years old with severely diminished testosterone levels already.  It is possible that his depressed testosterone is related to his trauma however his trauma was about 4 months ago and would not expect it to be so depressed.  Hopeful that we will receive a response in his labs in 12 weeks.  - Activity:             TDWB L leg              Unrestricted knee ROM   - FEN/GI prophylaxis/Foley/Lines:             Reg diet               - Impediments to fracture healing:             Vitamin d deficiency              Marijuana use                 - Dispo:             Dc home today              Follow up with ortho in 10-14 days  Signed:  Mearl Latin, PA-C [phone] (C) 10/03/2018, 11:29 AM  [facility] [address] [place] [address] [phone] Collier Bullock (F)

## 2018-10-03 NOTE — Discharge Instructions (Signed)
[facility] Discharge Instructions   General Discharge Instructions  WEIGHT BEARING STATUS: Touchdown weightbearing Left leg   RANGE OF MOTION/ACTIVITY: unrestricted range of motion left knee and ankle  Wound Care: daily wound care starting on 10/05/2018. See instructions below   Discharge Wound Care Instructions  Do NOT apply any ointments, solutions or lotions to pin sites or surgical wounds.  These prevent needed drainage and even though solutions like hydrogen peroxide kill bacteria, they also damage cells lining the pin sites that help fight infection.  Applying lotions or ointments can keep the wounds moist and can cause them to breakdown and open up as well. This can increase the risk for infection. When in doubt call the office.  Surgical incisions should be dressed daily.  If any drainage is noted, use one layer of adaptic, then gauze, Kerlix, and an ace wrap. Ok to use compression sock once there is no drainage. If there is no drainage you can put compression sock directly over the wounds   Once the incision is completely dry and without drainage, it may be left open to air out.  Showering may begin 36-48 hours later.  Cleaning gently with soap and water.  DVT/PE prophylaxis: Lovenox 40 mg subcutaneous injection daily x 14 days   Diet: as you were eating previously.  Can use over the counter stool softeners and bowel preparations, such as Miralax, to help with bowel movements.  Narcotics can be constipating.  Be sure to drink plenty of fluids  PAIN MEDICATION USE AND EXPECTATIONS  You have likely been given narcotic medications to help control your pain.  After a traumatic event that results in an fracture (broken bone) with or without surgery, it is ok to use narcotic pain medications to help control one's pain.  We understand that everyone responds to pain differently and each individual patient will be evaluated on a regular basis for the continued need for  narcotic medications. Ideally, narcotic medication use should last no more than 6-8 weeks (coinciding with fracture healing).   As a patient it is your responsibility as well to monitor narcotic medication use and report the amount and frequency you use these medications when you come to your office visit.   We would also advise that if you are using narcotic medications, you should take a dose prior to therapy to maximize you participation.  IF YOU ARE ON NARCOTIC MEDICATIONS IT IS NOT PERMISSIBLE TO OPERATE A MOTOR VEHICLE (MOTORCYCLE/CAR/TRUCK/MOPED) OR HEAVY MACHINERY DO NOT MIX NARCOTICS WITH OTHER CNS (CENTRAL NERVOUS SYSTEM) DEPRESSANTS SUCH AS ALCOHOL   STOP SMOKING OR USING NICOTINE PRODUCTS!!!!  As discussed nicotine severely impairs your body's ability to heal surgical and traumatic wounds but also impairs bone healing.  Wounds and bone heal by forming microscopic blood vessels (angiogenesis) and nicotine is a vasoconstrictor (essentially, shrinks blood vessels).  Therefore, if vasoconstriction occurs to these microscopic blood vessels they essentially disappear and are unable to deliver necessary nutrients to the healing tissue.  This is one modifiable factor that you can do to dramatically increase your chances of healing your injury.    (This means no smoking, no nicotine gum, patches, etc)  DO NOT USE NONSTEROIDAL ANTI-INFLAMMATORY DRUGS (NSAID'S)  Using products such as Advil (ibuprofen), Aleve (naproxen), Motrin (ibuprofen) for additional pain control during fracture healing can delay and/or prevent the healing response.  If you would like to take over the counter (OTC) medication, Tylenol (acetaminophen) is ok.  However, some narcotic medications that  are given for pain control contain acetaminophen as well. Therefore, you should not exceed more than 4000 mg of tylenol in a day if you do not have liver disease.  Also note that there are may OTC medicines, such as cold medicines and  allergy medicines that my contain tylenol as well.  If you have any questions about medications and/or interactions please ask your doctor/PA or your pharmacist.      ICE AND ELEVATE INJURED/OPERATIVE EXTREMITY  Using ice and elevating the injured extremity above your heart can help with swelling and pain control.  Icing in a pulsatile fashion, such as 20 minutes on and 20 minutes off, can be followed.    Do not place ice directly on skin. Make sure there is a barrier between to skin and the ice pack.    Using frozen items such as frozen peas works well as the conform nicely to the are that needs to be iced.  USE AN ACE WRAP OR TED HOSE FOR SWELLING CONTROL  In addition to icing and elevation, Ace wraps or TED hose are used to help limit and resolve swelling.  It is recommended to use Ace wraps or TED hose until you are informed to stop.    When using Ace Wraps start the wrapping distally (farthest away from the body) and wrap proximally (closer to the body)   Example: If you had surgery on your leg or thing and you do not have a splint on, start the ace wrap at the toes and work your way up to the thigh        If you had surgery on your upper extremity and do not have a splint on, start the ace wrap at your fingers and work your way up to the upper arm  IF YOU ARE IN A SPLINT OR CAST DO NOT REMOVE IT FOR ANY REASON   If your splint gets wet for any reason please contact the office immediately. You may shower in your splint or cast as long as you keep it dry.  This can be done by wrapping in a cast cover or garbage back (or similar)  Do Not stick any thing down your splint or cast such as pencils, money, or hangers to try and scratch yourself with.  If you feel itchy take benadryl as prescribed on the bottle for itching  IF YOU ARE IN A CAM BOOT (BLACK BOOT)  You may remove boot periodically. Perform daily dressing changes as noted below.  Wash the liner of the boot regularly and wear a sock when  wearing the boot. It is recommended that you sleep in the boot until told otherwise  CALL THE OFFICE WITH ANY QUESTIONS OR CONCERNS: [phone]

## 2018-10-03 NOTE — Plan of Care (Signed)
  Problem: Education: Goal: Knowledge of General Education information will improve Description Including pain rating scale, medication(s)/side effects and non-pharmacologic comfort measures Outcome: Progressing   Problem: Health Behavior/Discharge Planning: Goal: Ability to manage health-related needs will improve Outcome: Progressing   Problem: Clinical Measurements: Goal: Ability to maintain clinical measurements within normal limits will improve Outcome: Progressing Goal: Cardiovascular complication will be avoided Outcome: Progressing   Problem: Nutrition: Goal: Adequate nutrition will be maintained Outcome: Progressing   Problem: Coping: Goal: Level of anxiety will decrease Outcome: Progressing   Problem: Elimination: Goal: Will not experience complications related to urinary retention Outcome: Progressing   Problem: Safety: Goal: Ability to remain free from injury will improve Outcome: Progressing

## 2018-10-03 NOTE — Plan of Care (Signed)
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
## 2018-10-03 NOTE — Progress Notes (Signed)
Patient discharged home with family, discharge instructions provided to patient and family. All questions answered and concerns addressed. Patient/family verbalized understanding.

## 2018-10-04 LAB — NICOTINE/COTININE METABOLITES
Cotinine: 284.1 ng/mL
Nicotine: 10.3 ng/mL

## 2018-10-06 LAB — NICOTINE/COTININE METABOLITES
Cotinine: 143.8 ng/mL
Nicotine: 4.8 ng/mL

## 2018-10-06 LAB — AEROBIC/ANAEROBIC CULTURE W GRAM STAIN (SURGICAL/DEEP WOUND): Culture: NO GROWTH

## 2018-10-07 LAB — TESTOSTERONE, % FREE: Testosterone-% Free: 1.4 % — ABNORMAL HIGH (ref 0.2–0.7)

## 2018-10-21 DIAGNOSIS — S82252K Displaced comminuted fracture of shaft of left tibia, subsequent encounter for closed fracture with nonunion: Secondary | ICD-10-CM | POA: Diagnosis not present

## 2018-11-08 ENCOUNTER — Encounter (HOSPITAL_COMMUNITY): Payer: Self-pay | Admitting: Emergency Medicine

## 2018-11-08 ENCOUNTER — Other Ambulatory Visit: Payer: Self-pay

## 2018-11-08 ENCOUNTER — Emergency Department (HOSPITAL_COMMUNITY)
Admission: EM | Admit: 2018-11-08 | Discharge: 2018-11-08 | Disposition: A | Discharge status: Home / Self Care | Payer: Medicaid Other | Attending: Emergency Medicine | Admitting: Emergency Medicine

## 2018-11-08 DIAGNOSIS — R21 Rash and other nonspecific skin eruption: Secondary | ICD-10-CM | POA: Diagnosis not present

## 2018-11-08 DIAGNOSIS — R82998 Other abnormal findings in urine: Secondary | ICD-10-CM | POA: Diagnosis not present

## 2018-11-08 DIAGNOSIS — F419 Anxiety disorder, unspecified: Secondary | ICD-10-CM | POA: Insufficient documentation

## 2018-11-08 LAB — URINALYSIS, ROUTINE W REFLEX MICROSCOPIC
Bilirubin Urine: NEGATIVE
Glucose, UA: NEGATIVE mg/dL
Hgb urine dipstick: NEGATIVE
Ketones, ur: NEGATIVE mg/dL
Leukocytes, UA: NEGATIVE
Nitrite: NEGATIVE
Protein, ur: NEGATIVE mg/dL
SPECIFIC GRAVITY, URINE: 1.011 (ref 1.005–1.030)
pH: 7 (ref 5.0–8.0)

## 2018-11-08 LAB — COMPREHENSIVE METABOLIC PANEL
ALT: 21 U/L (ref 0–44)
ANION GAP: 9 (ref 5–15)
AST: 25 U/L (ref 15–41)
Albumin: 4.1 g/dL (ref 3.5–5.0)
Alkaline Phosphatase: 104 U/L (ref 38–126)
BUN: 5 mg/dL — ABNORMAL LOW (ref 6–20)
CO2: 28 mmol/L (ref 22–32)
Calcium: 9.5 mg/dL (ref 8.9–10.3)
Chloride: 100 mmol/L (ref 98–111)
Creatinine, Ser: 0.81 mg/dL (ref 0.61–1.24)
GFR calc Af Amer: >60 mL/min (ref >60)
GFR calc non Af Amer: >60 mL/min (ref >60)
Glucose, Bld: 105 mg/dL — ABNORMAL HIGH (ref 70–99)
Potassium: 3.5 mmol/L (ref 3.5–5.1)
SODIUM: 137 mmol/L (ref 135–145)
Total Bilirubin: 1.1 mg/dL (ref 0.3–1.2)
Total Protein: 7.6 g/dL (ref 6.5–8.1)

## 2018-11-08 LAB — CBC
HCT: 46 % (ref 39.0–52.0)
Hemoglobin: 14.5 g/dL (ref 13.0–17.0)
MCH: 29.1 pg (ref 26.0–34.0)
MCHC: 31.5 g/dL (ref 30.0–36.0)
MCV: 92.4 fL (ref 80.0–100.0)
Platelets: 313 10*3/uL (ref 150–400)
RBC: 4.98 MIL/uL (ref 4.22–5.81)
RDW: 12.2 % (ref 11.5–15.5)
WBC: 5.1 10*3/uL (ref 4.0–10.5)
nRBC: 0 % (ref 0.0–0.2)

## 2018-11-08 MED ORDER — PERMETHRIN 5 % EX CREA: TOPICAL_CREAM | CUTANEOUS | 0 refills | Status: DC

## 2018-11-08 NOTE — ED Triage Notes (Signed)
Onset of a rash 3 months hands, feet, legs with itching. States infrequent urination and orange/ dark yellow in color. Denies dysuria.

## 2018-11-08 NOTE — Discharge Instructions (Addendum)
You were evaluated in the Emergency Department and after careful evaluation, we did not find any emergent condition requiring admission or further testing in the hospital.  Your labs were reassuring and your kidney and liver function was normal.  The cause of your rash is still unclear, it may be related to allergy from your Percocet or possibly scabies.  You can use the prescription provided to try to treat scabies if interested.  Please follow-up with your regular doctor or dermatologist if this rash does not resolve.  Please return to the Emergency Department if you experience any worsening of your condition.  We encourage you to follow up with a primary care provider.  Thank you for allowing [place] to be a part of your care.

## 2018-11-08 NOTE — ED Triage Notes (Signed)
Pt here from home with dark urine and a rash times 3 months

## 2018-11-08 NOTE — ED Notes (Signed)
Pt stable, ambulatory, states understanding of discharge instructions 
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
## 2018-11-09 LAB — RPR: RPR Ser Ql: NONREACTIVE

## 2018-11-19 DIAGNOSIS — S82252K Displaced comminuted fracture of shaft of left tibia, subsequent encounter for closed fracture with nonunion: Secondary | ICD-10-CM | POA: Diagnosis not present

## 2019-01-01 DIAGNOSIS — S82252K Displaced comminuted fracture of shaft of left tibia, subsequent encounter for closed fracture with nonunion: Secondary | ICD-10-CM | POA: Diagnosis not present

## 2019-01-10 IMAGING — DX DG TIBIA/FIBULA PORT 2V*L*
1 series · 4 of 4 positions shown · non-contrast
Comparison: Radiographs June 17, 2018.

CLINICAL DATA: Left tibial fracture with nonunion.

EXAM:
PORTABLE LEFT TIBIA AND FIBULA - 2 VIEW

[Series 1: leg · 0.14mm/px · 4 of 4 slices shown]
[im 1/4]
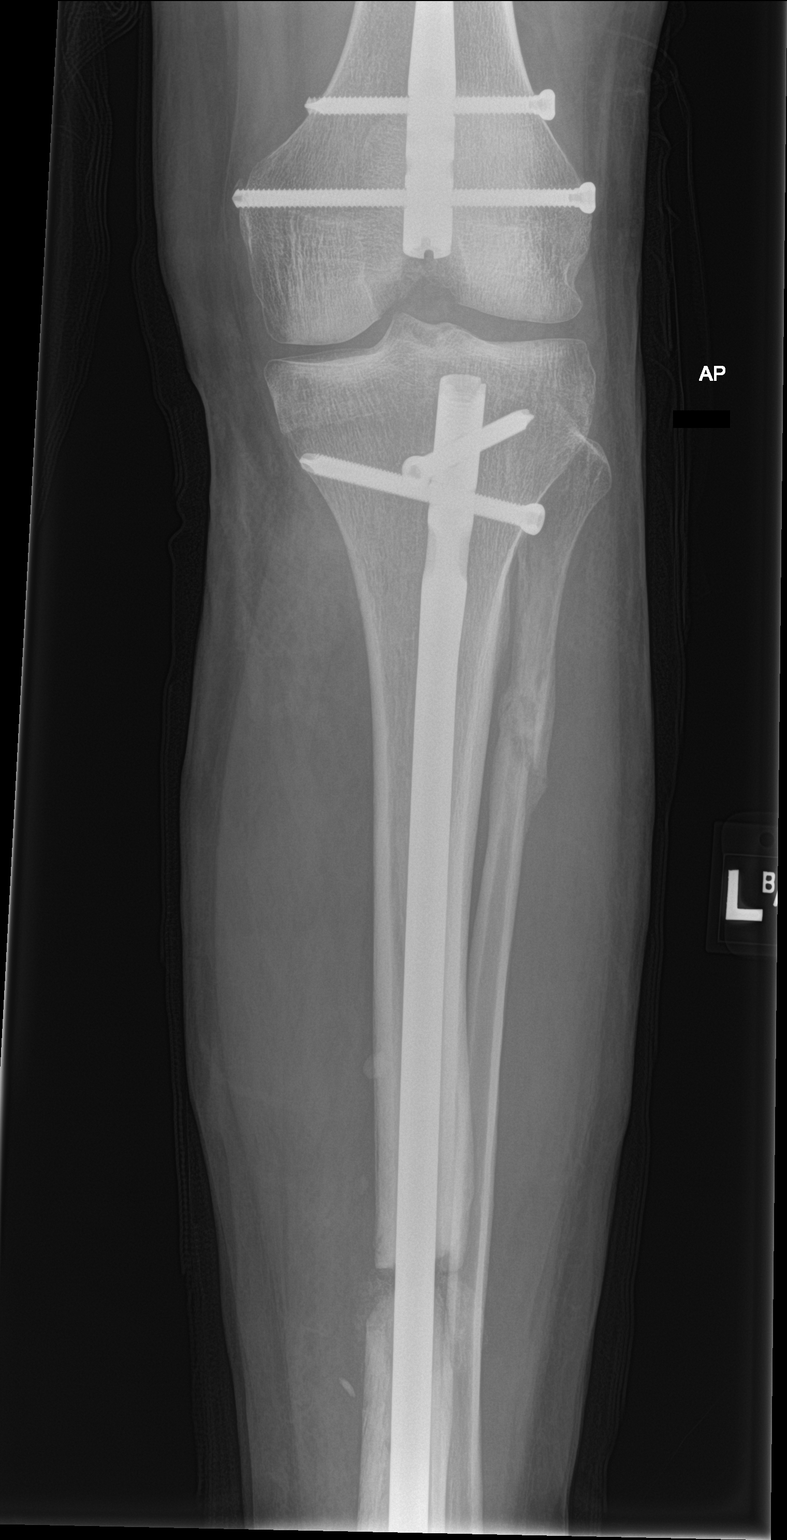
[im 2/4]
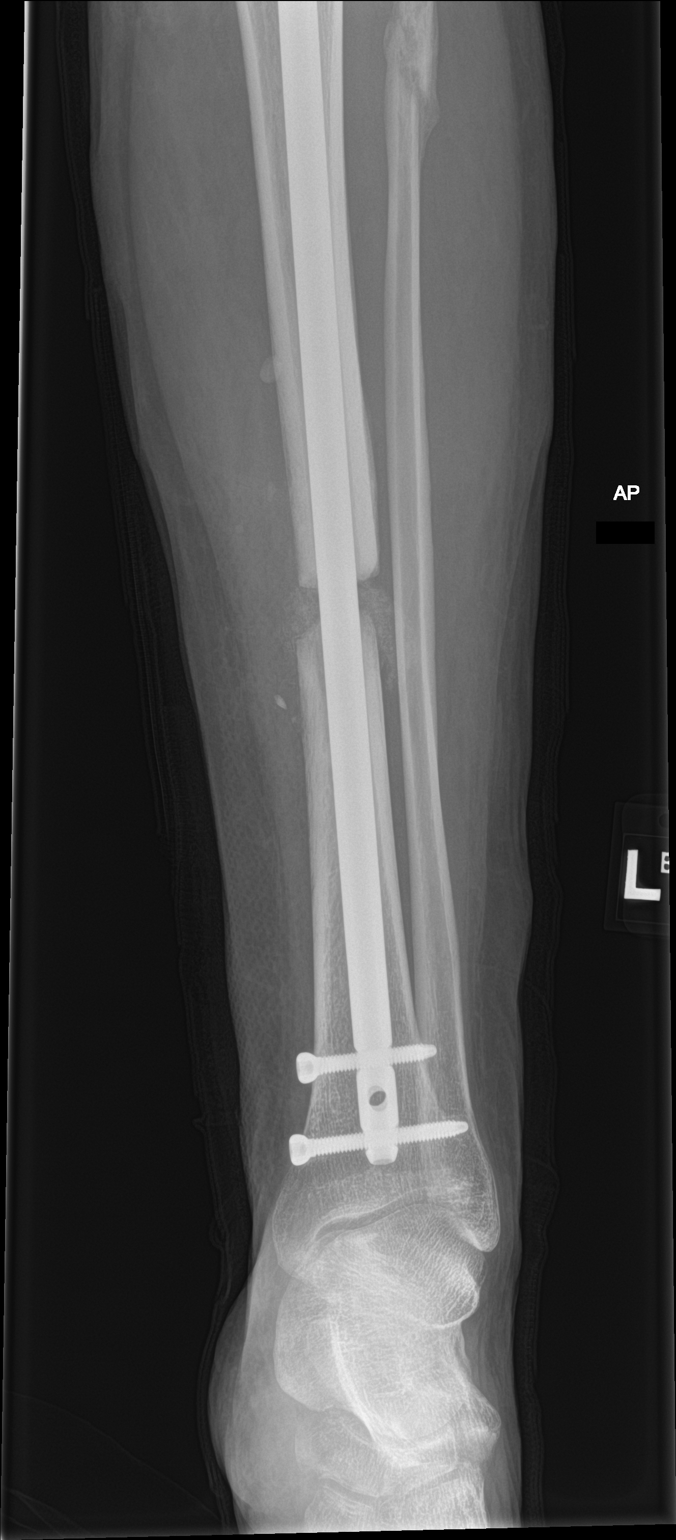
[im 3/4]
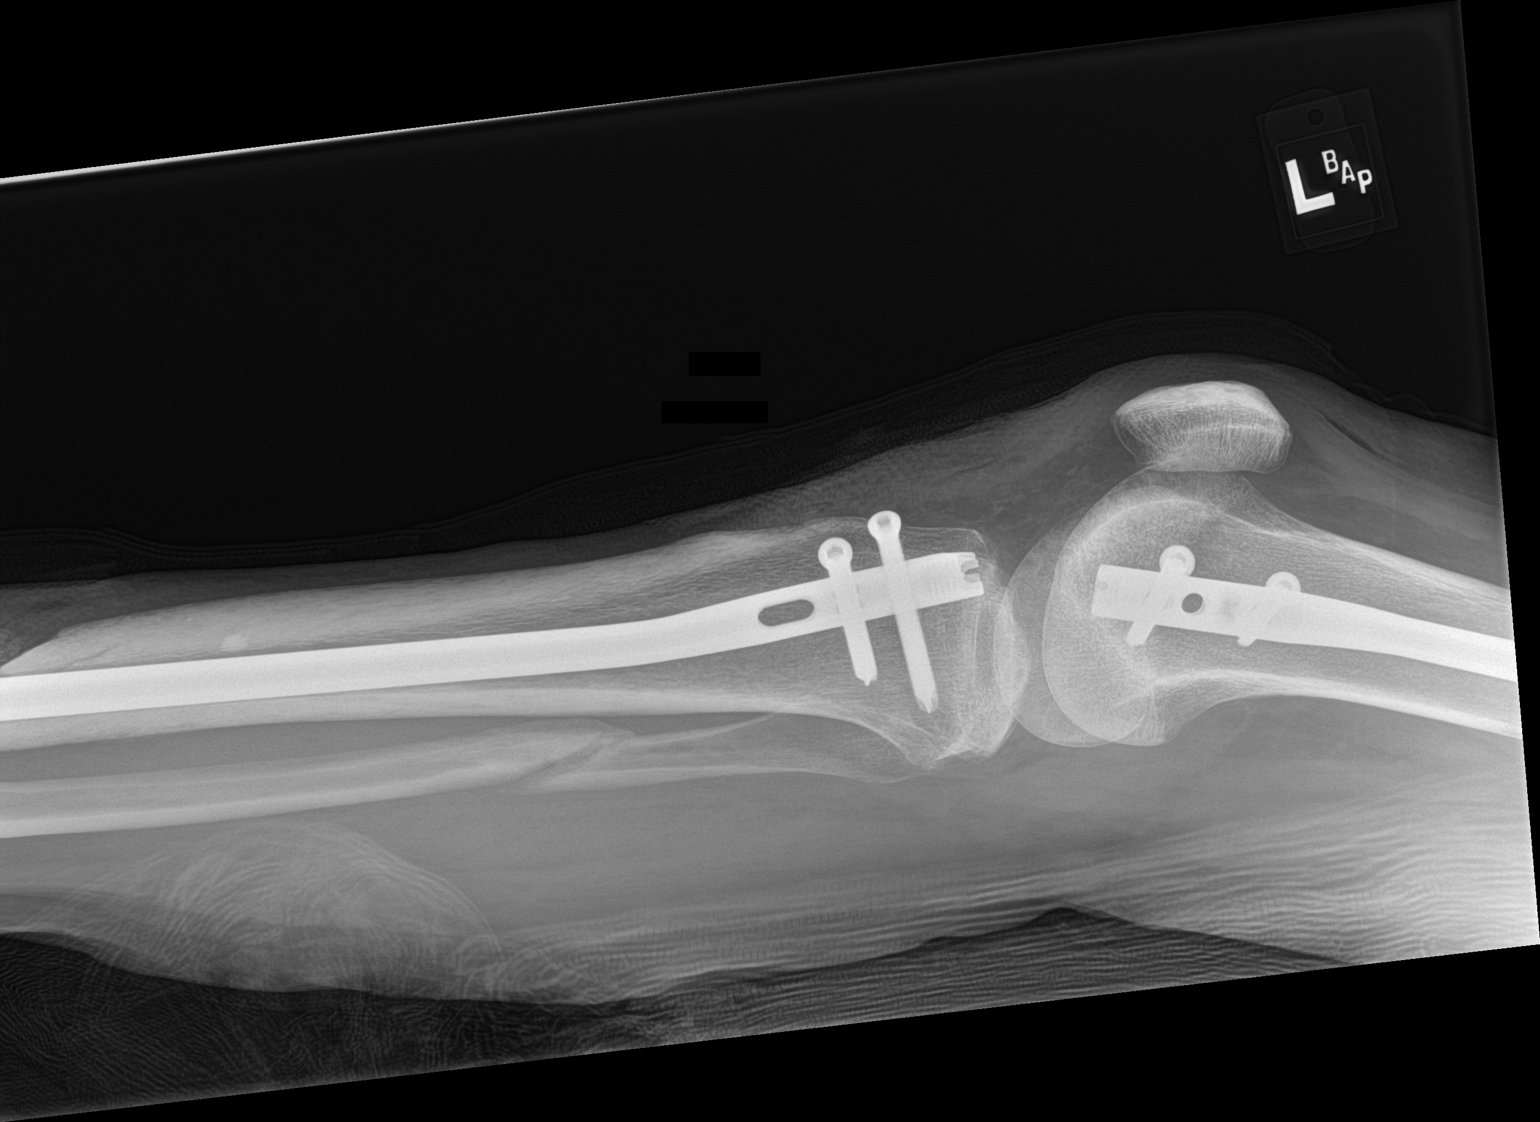
[im 4/4]
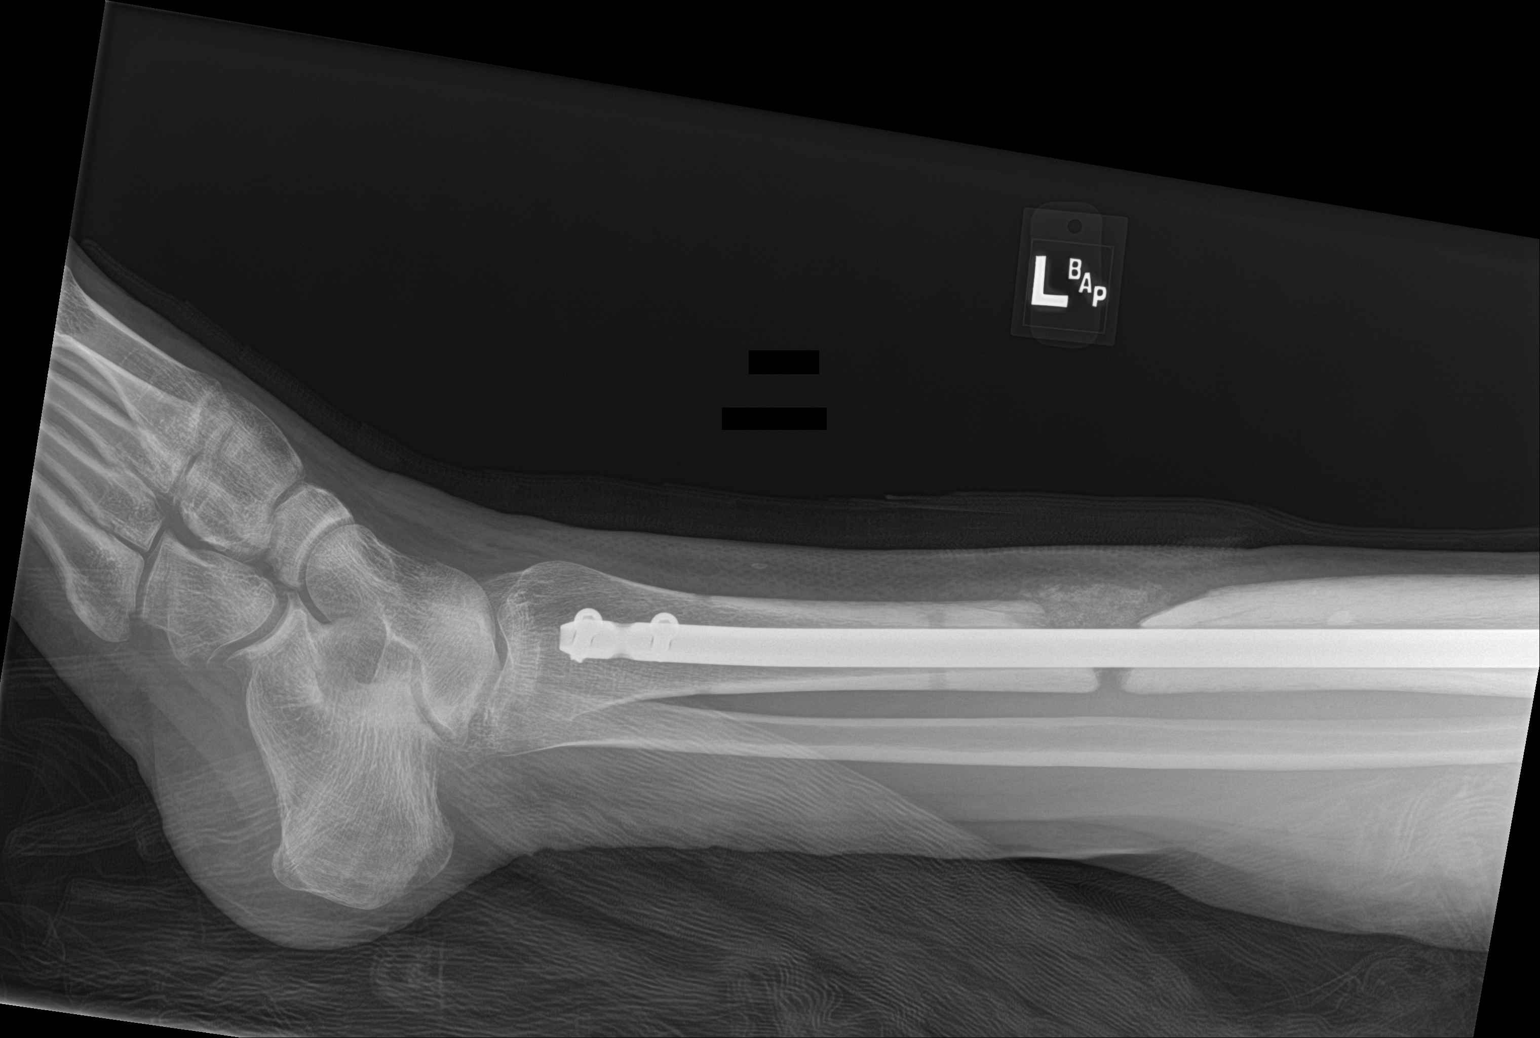

[4 of 4 positions shown; findings below may reference images not displayed]

FINDINGS: Status post intramedullary rod fixation of distal left tibial shaft
fracture with persistent nonunion. Old healed proximal left fibular
fracture is noted. No soft tissue abnormality is noted.
IMPRESSION: Status post intramedullary rod fixation of distal left tibial shaft
fracture with persistent nonunion.

## 2019-02-19 DIAGNOSIS — B86 Scabies: Secondary | ICD-10-CM | POA: Diagnosis not present

## 2019-02-19 DIAGNOSIS — R030 Elevated blood-pressure reading, without diagnosis of hypertension: Secondary | ICD-10-CM | POA: Diagnosis not present

## 2019-03-26 DIAGNOSIS — I10 Essential (primary) hypertension: Secondary | ICD-10-CM | POA: Diagnosis not present

## 2019-03-26 DIAGNOSIS — R079 Chest pain, unspecified: Secondary | ICD-10-CM | POA: Diagnosis not present

## 2019-04-09 ENCOUNTER — Ambulatory Visit: Payer: Medicaid Other | Attending: Nurse Practitioner | Admitting: Nurse Practitioner

## 2019-04-09 ENCOUNTER — Encounter: Payer: Self-pay | Admitting: Nurse Practitioner

## 2019-04-09 ENCOUNTER — Other Ambulatory Visit: Payer: Self-pay

## 2019-04-09 DIAGNOSIS — Z823 Family history of stroke: Secondary | ICD-10-CM | POA: Insufficient documentation

## 2019-04-09 DIAGNOSIS — G35 Multiple sclerosis: Secondary | ICD-10-CM | POA: Insufficient documentation

## 2019-04-09 DIAGNOSIS — F909 Attention-deficit hyperactivity disorder, unspecified type: Secondary | ICD-10-CM | POA: Insufficient documentation

## 2019-04-09 DIAGNOSIS — F329 Major depressive disorder, single episode, unspecified: Secondary | ICD-10-CM | POA: Diagnosis not present

## 2019-04-09 DIAGNOSIS — R002 Palpitations: Secondary | ICD-10-CM | POA: Diagnosis not present

## 2019-04-09 DIAGNOSIS — K219 Gastro-esophageal reflux disease without esophagitis: Secondary | ICD-10-CM | POA: Diagnosis not present

## 2019-04-09 DIAGNOSIS — F419 Anxiety disorder, unspecified: Secondary | ICD-10-CM

## 2019-04-09 DIAGNOSIS — I10 Essential (primary) hypertension: Secondary | ICD-10-CM

## 2019-04-09 DIAGNOSIS — Z8249 Family history of ischemic heart disease and other diseases of the circulatory system: Secondary | ICD-10-CM | POA: Diagnosis not present

## 2019-04-09 MED ORDER — AMLODIPINE BESYLATE 5 MG PO TABS: 5.0000 mg | ORAL_TABLET | Freq: Every day | ORAL | 3 refills | Status: DC

## 2019-04-09 MED ORDER — HYDROXYZINE HCL 10 MG PO TABS: 10.0000 mg | ORAL_TABLET | Freq: Three times a day (TID) | ORAL | 1 refills | Status: DC | PRN

## 2019-04-09 MED ORDER — FLUOXETINE HCL 20 MG PO TABS: 20.0000 mg | ORAL_TABLET | Freq: Every day | ORAL | 3 refills | Status: DC
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
## 2019-04-10 ENCOUNTER — Other Ambulatory Visit: Payer: Self-pay

## 2019-04-10 DIAGNOSIS — F32A Depression, unspecified: Secondary | ICD-10-CM

## 2019-04-10 DIAGNOSIS — F329 Major depressive disorder, single episode, unspecified: Secondary | ICD-10-CM

## 2019-04-10 MED ORDER — FLUOXETINE HCL 20 MG PO CAPS: 20.0000 mg | ORAL_CAPSULE | Freq: Every day | ORAL | 3 refills | Status: DC

## 2019-04-10 NOTE — Telephone Encounter (Signed)
Changed from tablet to capsule due to insurance preference.

## 2019-04-30 ENCOUNTER — Other Ambulatory Visit: Payer: Self-pay

## 2019-04-30 ENCOUNTER — Ambulatory Visit: Payer: No Typology Code available for payment source | Attending: Nurse Practitioner | Admitting: Nurse Practitioner

## 2019-04-30 ENCOUNTER — Encounter: Payer: Self-pay | Admitting: Nurse Practitioner

## 2019-04-30 VITALS — BP 165/81 | HR 70 | Temp 99.0°F | Ht 71.0 in | Wt 202.4 lb

## 2019-04-30 DIAGNOSIS — R079 Chest pain, unspecified: Secondary | ICD-10-CM | POA: Diagnosis not present

## 2019-04-30 DIAGNOSIS — F32A Depression, unspecified: Secondary | ICD-10-CM

## 2019-04-30 DIAGNOSIS — F419 Anxiety disorder, unspecified: Secondary | ICD-10-CM | POA: Diagnosis not present

## 2019-04-30 DIAGNOSIS — F329 Major depressive disorder, single episode, unspecified: Secondary | ICD-10-CM | POA: Diagnosis not present

## 2019-04-30 DIAGNOSIS — I10 Essential (primary) hypertension: Secondary | ICD-10-CM | POA: Diagnosis not present

## 2019-04-30 MED ORDER — AMLODIPINE BESYLATE 10 MG PO TABS: 10.0000 mg | ORAL_TABLET | Freq: Every day | ORAL | 1 refills | Status: DC

## 2019-04-30 MED ORDER — HYDROXYZINE HCL 50 MG PO TABS: 50.0000 mg | ORAL_TABLET | Freq: Three times a day (TID) | ORAL | 1 refills | Status: DC | PRN

## 2019-04-30 NOTE — Progress Notes (Signed)
Assessment & Plan:  Noah Duke was seen today for follow-up.  Diagnoses and all orders for this visit:  Essential hypertension -     amLODipine (NORVASC) 10 MG tablet; Take 1 tablet (10 mg total) by mouth daily. -     CMP14+EGFR -     Lipid panel -     CBC Continue all antihypertensives as prescribed.  Remember to bring in your blood pressure log with you for your follow up appointment.  DASH/Mediterranean Diets are healthier choices for HTN.    Anxiety and depression -     hydrOXYzine (ATARAX/VISTARIL) 50 MG tablet; Take 1 tablet (50 mg total) by mouth 3 (three) times daily as needed for anxiety. -     TSH  Chest pain, unspecified type -EKG normal  Patient has been counseled on age-appropriate routine health concerns for screening and prevention. These are reviewed and up-to-date. Referrals have been placed accordingly. Immunizations are up-to-date or declined.    Subjective:   Chief Complaint  Patient presents with  . Follow-up    Pt. is here to follow up on anxiety and Blood pressure check. Pt. stated he stopped taking Amlodipine 4m for two weeks because he felt like it was not working and took his Mom Metoprolol 537m1-2x a day and saw a little improving results.    HPI Noah DONLEY89.o. male presents to office today for follow up.  has a past medical history of ADHD, Anxiety, Complication of anesthesia, Depression, Displaced comminuted fracture of shaft of left fibula, initial encounter for closed fracture (06/18/2018), [place]), GERD (gastroesophageal reflux disease), H/O seasonal allergies, Hypertension, Open fracture of proximal end of right tibia, type IIIB (06/18/2018), Scabies, and Wears glasses.    Essential hypertension He was started on amlodipine 5 mg a few weeks ago. Stated after a few days he felt it was not working so he stopped it and started taking his mom's metoprolol. I explained to him that he should always call the office  prior to stopping any medications and metoprolol would not be a medication that he should be taking especially without prior approval. He is agreeable to starting amlodipine 1047maily.  Home readings 130-160/80-100s. He endorses chest pain at rest. Onset 1 month ago. No aggravating factors. Goes away on its on. Located in the left chest. Can last from minutes to hours. Described as sharp or dull. Mother and father both have heart disease. Mom has afib. Dad has had an MI. Will order EKG. Lipids pending.  BP Readings from Last 3 Encounters:  04/30/19 (!) 165/81  11/08/18 (!) 154/103  10/03/18 (!) 153/93    Anxiety He was started on prozac 64m50mfew weeks ago as well as hydroxyzine 10 mg TID. He states the prozac has been helpful. The hydroxyzine has only slightly improved his anxiety. He declines referral to psychiatry. Will increase hydroxyzine at this time.  Depression screen PHQ [facility] 04/30/2019 04/09/2019 11/10/2016  Decreased Interest 1 0 0  Down, Depressed, Hopeless 1 2 0  PHQ - 2 Score 2 2 0  Altered sleeping 2 2 -  Tired, decreased energy 2 2 -  Change in appetite 1 2 -  Feeling bad or failure about yourself  1 2 -  Trouble concentrating 1 0 -  Moving slowly or fidgety/restless 0 0 -  Suicidal thoughts 0 0 -  PHQ-9 Score 9 10 -    GAD 7 : Generalized Anxiety Score 04/30/2019 04/09/2019  Nervous, Anxious,  on Edge 3 2  Control/stop worrying 2 2  Worry too much - different things 2 2  Trouble relaxing 2 3  Restless 2 3  Easily annoyed or irritable 1 3  Afraid - awful might happen 3 3  Total GAD 7 Score 15 18     Review of Systems  Constitutional: Negative for fever, malaise/fatigue and weight loss.  HENT: Negative.  Negative for nosebleeds.   Eyes: Negative.  Negative for blurred vision, double vision and photophobia.  Respiratory: Negative.  Negative for cough and shortness of breath.   Cardiovascular: Positive for chest pain. Negative for palpitations and leg swelling.   Gastrointestinal: Negative.  Negative for heartburn, nausea and vomiting.  Musculoskeletal: Negative.  Negative for myalgias.  Neurological: Negative.  Negative for dizziness, focal weakness, seizures and headaches.  Psychiatric/Behavioral: Positive for depression. Negative for hallucinations, memory loss, substance abuse and suicidal ideas. The patient is nervous/anxious. The patient does not have insomnia.     Past Medical History:  Diagnosis Date  . ADHD   . Anxiety   . Complication of anesthesia    " difficulty staying asleep during a couple of surgeries "  . Depression   . Displaced comminuted fracture of shaft of left fibula, initial encounter for closed fracture 06/18/2018  . Femur fracture, left ([place]) 06/18/2018  . GERD (gastroesophageal reflux disease)   . H/O seasonal allergies   . Hypertension   . Open fracture of proximal end of right tibia, type IIIB 06/18/2018  . Scabies   . Wears glasses     Past Surgical History:  Procedure Laterality Date  . DRESSING CHANGE UNDER ANESTHESIA Left 06/20/2018   Procedure: DRESSING CHANGE UNDER ANESTHESIA LEFT LEG;  Surgeon: Altamese Carlock, MD;  Location: [place];  Service: Orthopedics;  Laterality: Left;  . EXTERNAL FIXATION LEG Left 06/14/2018   Procedure: LOWER EXTREMITY KNEE SPANNING EX FIX;  Surgeon: Leandrew Koyanagi, MD;  Location: [place];  Service: Orthopedics;  Laterality: Left;  . FEMUR IM NAIL Left 06/17/2018   Procedure: INTRAMEDULLARY (IM) RETROGRADE FEMORAL NAILING;  Surgeon: Altamese Atmautluak, MD;  Location: [place];  Service: Orthopedics;  Laterality: Left;  . I&D EXTREMITY Right 06/14/2018   Procedure: IRRIGATION AND DEBRIDEMENT TIB FIB EX FIX AND WOUND VAC APPLICATION;  Surgeon: Leandrew Koyanagi, MD;  Location: [place];  Service: Orthopedics;  Laterality: Right;  . I&D EXTREMITY Right 06/17/2018   Procedure: IRRIGATION AND DEBRIDEMENT EXTREMITY;  Surgeon: Altamese Binger, MD;  Location: [place];  Service: Orthopedics;  Laterality: Right;  . I&D  EXTREMITY Right 06/20/2018   Procedure: IRRIGATION AND DEBRIDEMENT EXTREMITY R tibia;  Surgeon: Altamese Tilden, MD;  Location: [place];  Service: Orthopedics;  Laterality: Right;  . INTRAMEDULLARY (IM) NAIL INTERTROCHANTERIC Left 10/01/2018   Procedure: EXCHANGE LEFT TIBIAL NAIL;  Surgeon: Altamese [place] Level, MD;  Location: [place];  Service: Orthopedics;  Laterality: Left;  . LEG SURGERY     Both leg  . OPEN REDUCTION INTERNAL FIXATION (ORIF) METACARPAL Right 03/02/2016   Procedure: OPEN REDUCTION INTERNAL FIXATION (ORIF) 5TH METACARPAL RIGHT LITTLE FINGER;  Surgeon: Ninetta Lights, MD;  Location: [place];  Service: Orthopedics;  Laterality: Right;  . ORIF TIBIA PLATEAU Right 06/17/2018   Procedure: OPEN REDUCTION INTERNAL FIXATION (ORIF) TIBIAL PLATEAU;  Surgeon: Altamese Frank, MD;  Location: [place];  Service: Orthopedics;  Laterality: Right;  . TIBIA IM NAIL INSERTION Left 06/17/2018   Procedure: INTRAMEDULLARY (IM) NAIL TIBIAL;  Surgeon: Altamese Mundelein, MD;  Location:  [place] [facility];  Service: Orthopedics;  Laterality: Left;  . WISDOM TOOTH EXTRACTION      Family History  Problem Relation Age of Onset  . Hypertension Other   . Multiple sclerosis Mother   . Heart attack Father   . Stroke Father   . Hypertension Father     Social History Reviewed with no changes to be made today.   Outpatient Medications Prior to Visit  Medication Sig Dispense Refill  . calamine lotion Apply 1 application topically as needed for itching.    Marland Kitchen FLUoxetine (PROZAC) 20 MG capsule Take 1 capsule (20 mg total) by mouth daily. 30 capsule 3  . hydrOXYzine (ATARAX/VISTARIL) 10 MG tablet Take 1 tablet (10 mg total) by mouth 3 (three) times daily as needed for anxiety. 60 tablet 1  . calcium carbonate (OS-CAL - DOSED IN MG OF ELEMENTAL CALCIUM) 1250 (500 Ca) MG tablet Take 1 tablet (500 mg of elemental calcium total) by mouth 2 (two) times daily with a meal. (Patient not taking: Reported on 04/09/2019)    .  amLODipine (NORVASC) 5 MG tablet Take 1 tablet (5 mg total) by mouth daily. (Patient not taking: Reported on 04/30/2019) 90 tablet 3   No facility-administered medications prior to visit.     Allergies  Allergen Reactions  . Other     cats  . Poison Ivy Extract        Objective:    BP (!) 165/81 (BP Location: Right Arm, Patient Position: Sitting, Cuff Size: Normal)   Pulse 70   Temp 99 F (37.2 C) (Oral)   Ht 5' 11"  (1.803 m)   Wt 202 lb 6.4 oz (91.8 kg)   SpO2 97%   BMI 28.23 kg/m  Wt Readings from Last 3 Encounters:  04/30/19 202 lb 6.4 oz (91.8 kg)  11/08/18 170 lb (77.1 kg)  10/01/18 180 lb (81.6 kg)    Physical Exam Vitals signs and nursing note reviewed.  Constitutional:      Appearance: He is well-developed.  HENT:     Head: Normocephalic and atraumatic.  Neck:     Musculoskeletal: Normal range of motion.  Cardiovascular:     Rate and Rhythm: Normal rate and regular rhythm.     Heart sounds: Normal heart sounds. No murmur. No friction rub. No gallop.   Pulmonary:     Effort: Pulmonary effort is normal. No tachypnea or respiratory distress.     Breath sounds: Normal breath sounds. No decreased breath sounds, wheezing, rhonchi or rales.  Chest:     Chest wall: No swelling or tenderness.     Breasts:        Right: Normal.        Left: Normal.  Abdominal:     General: Bowel sounds are normal.     Palpations: Abdomen is soft.  Musculoskeletal:     Right lower leg: No edema.     Left lower leg: No edema.  Skin:    General: Skin is warm and dry.  Neurological:     Mental Status: He is alert and oriented to person, place, and time.     Coordination: Coordination normal.  Psychiatric:        Behavior: Behavior normal. Behavior is cooperative.        Thought Content: Thought content normal.        Judgment: Judgment normal.          Patient has been counseled extensively about nutrition and exercise as well as the importance  of adherence with  medications and regular follow-up. The patient was given clear instructions to go to ER or return to medical center if symptoms don't improve, worsen or new problems develop. The patient verbalized understanding.   Follow-up: Return in about 3 weeks (around 05/21/2019) for BP RECHECK WITH LUKE.   Gildardo Pounds, FNP-BC [facility], [place]   04/30/2019, 3:24 PM

## 2019-05-01 LAB — CMP14+EGFR
ALT: 20 IU/L (ref 0–44)
AST: 19 IU/L (ref 0–40)
Albumin/Globulin Ratio: 1.8 (ref 1.2–2.2)
Albumin: 4.8 g/dL (ref 4.1–5.2)
Alkaline Phosphatase: 75 IU/L (ref 39–117)
BUN/Creatinine Ratio: 11 (ref 9–20)
BUN: 9 mg/dL (ref 6–20)
Bilirubin Total: 0.7 mg/dL (ref 0.0–1.2)
CO2: 21 mmol/L (ref 20–29)
Calcium: 9.2 mg/dL (ref 8.7–10.2)
Chloride: 99 mmol/L (ref 96–106)
Creatinine, Ser: 0.83 mg/dL (ref 0.76–1.27)
GFR calc Af Amer: 139 mL/min/{1.73_m2} (ref >59)
GFR calc non Af Amer: 121 mL/min/{1.73_m2} (ref >59)
Globulin, Total: 2.7 g/dL (ref 1.5–4.5)
Glucose: 87 mg/dL (ref 65–99)
Potassium: 4.2 mmol/L (ref 3.5–5.2)
Sodium: 139 mmol/L (ref 134–144)
Total Protein: 7.5 g/dL (ref 6.0–8.5)

## 2019-05-01 LAB — CBC
Hematocrit: 47.1 % (ref 37.5–51.0)
Hemoglobin: 15.2 g/dL (ref 13.0–17.7)
MCH: 31.2 pg (ref 26.6–33.0)
MCHC: 32.3 g/dL (ref 31.5–35.7)
MCV: 97 fL (ref 79–97)
Platelets: 261 10*3/uL (ref 150–450)
RBC: 4.87 x10E6/uL (ref 4.14–5.80)
RDW: 13.1 % (ref 11.6–15.4)
WBC: 5 10*3/uL (ref 3.4–10.8)

## 2019-05-01 LAB — LIPID PANEL
Chol/HDL Ratio: 2.5 ratio (ref 0.0–5.0)
Cholesterol, Total: 176 mg/dL (ref 100–199)
HDL: 70 mg/dL (ref >39)
LDL Calculated: 94 mg/dL (ref 0–99)
Triglycerides: 59 mg/dL (ref 0–149)
VLDL Cholesterol Cal: 12 mg/dL (ref 5–40)

## 2019-05-01 LAB — TSH: TSH: 0.901 u[IU]/mL (ref 0.450–4.500)

## 2019-05-21 ENCOUNTER — Other Ambulatory Visit: Payer: Self-pay

## 2019-05-21 ENCOUNTER — Ambulatory Visit: Payer: Medicaid Other | Attending: Family Medicine | Admitting: Pharmacist

## 2019-05-21 ENCOUNTER — Encounter: Payer: Self-pay | Admitting: Pharmacist

## 2019-05-21 VITALS — BP 128/81 | HR 75

## 2019-05-21 DIAGNOSIS — I10 Essential (primary) hypertension: Secondary | ICD-10-CM

## 2019-05-21 DIAGNOSIS — Z79899 Other long term (current) drug therapy: Secondary | ICD-10-CM | POA: Diagnosis not present

## 2019-05-21 DIAGNOSIS — Z8249 Family history of ischemic heart disease and other diseases of the circulatory system: Secondary | ICD-10-CM | POA: Insufficient documentation

## 2019-05-21 NOTE — Patient Instructions (Signed)
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
## 2019-05-22 ENCOUNTER — Encounter: Payer: Self-pay | Admitting: Pharmacist

## 2019-06-23 ENCOUNTER — Ambulatory Visit: Payer: Medicaid Other | Attending: Nurse Practitioner | Admitting: Pharmacist

## 2019-06-23 ENCOUNTER — Encounter: Payer: Self-pay | Admitting: Pharmacist

## 2019-06-23 ENCOUNTER — Other Ambulatory Visit: Payer: Self-pay

## 2019-06-23 VITALS — BP 127/84 | HR 74

## 2019-06-23 DIAGNOSIS — Z8249 Family history of ischemic heart disease and other diseases of the circulatory system: Secondary | ICD-10-CM | POA: Diagnosis not present

## 2019-06-23 DIAGNOSIS — Z79899 Other long term (current) drug therapy: Secondary | ICD-10-CM | POA: Diagnosis not present

## 2019-06-23 DIAGNOSIS — I10 Essential (primary) hypertension: Secondary | ICD-10-CM | POA: Diagnosis not present
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
## 2019-06-27 ENCOUNTER — Other Ambulatory Visit: Payer: Self-pay | Admitting: Nurse Practitioner

## 2019-06-27 DIAGNOSIS — F329 Major depressive disorder, single episode, unspecified: Secondary | ICD-10-CM

## 2019-06-27 DIAGNOSIS — F419 Anxiety disorder, unspecified: Secondary | ICD-10-CM

## 2019-06-27 MED ORDER — SERTRALINE HCL 50 MG PO TABS: 50.0000 mg | ORAL_TABLET | Freq: Every day | ORAL | 3 refills | Status: DC

## 2019-07-07 ENCOUNTER — Other Ambulatory Visit: Payer: Self-pay | Admitting: Nurse Practitioner

## 2019-07-07 DIAGNOSIS — M25551 Pain in right hip: Secondary | ICD-10-CM

## 2019-07-11 DIAGNOSIS — H16223 Keratoconjunctivitis sicca, not specified as Sjogren's, bilateral: Secondary | ICD-10-CM | POA: Diagnosis not present

## 2019-07-11 DIAGNOSIS — H40033 Anatomical narrow angle, bilateral: Secondary | ICD-10-CM | POA: Diagnosis not present

## 2019-07-23 ENCOUNTER — Other Ambulatory Visit: Payer: Self-pay

## 2019-07-23 ENCOUNTER — Ambulatory Visit: Payer: Medicaid Other | Attending: Nurse Practitioner | Admitting: Physical Therapy

## 2019-07-23 ENCOUNTER — Encounter: Payer: Self-pay | Admitting: Physical Therapy

## 2019-07-23 DIAGNOSIS — M6281 Muscle weakness (generalized): Secondary | ICD-10-CM | POA: Diagnosis not present

## 2019-07-23 DIAGNOSIS — M79605 Pain in left leg: Secondary | ICD-10-CM | POA: Insufficient documentation

## 2019-07-23 DIAGNOSIS — M79604 Pain in right leg: Secondary | ICD-10-CM | POA: Diagnosis not present

## 2019-07-23 DIAGNOSIS — R2689 Other abnormalities of gait and mobility: Secondary | ICD-10-CM | POA: Diagnosis not present
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
## 2019-07-24 ENCOUNTER — Ambulatory Visit: Payer: No Typology Code available for payment source | Attending: Family Medicine | Admitting: Pharmacist

## 2019-07-24 VITALS — BP 132/87 | HR 107

## 2019-07-24 DIAGNOSIS — I10 Essential (primary) hypertension: Secondary | ICD-10-CM

## 2019-07-24 MED ORDER — AMLODIPINE BESYLATE 10 MG PO TABS: 10.0000 mg | ORAL_TABLET | Freq: Every day | ORAL | 0 refills | Status: DC

## 2019-07-24 MED ORDER — SERTRALINE HCL 100 MG PO TABS: 100.0000 mg | ORAL_TABLET | Freq: Every day | ORAL | 0 refills | Status: DC

## 2019-07-24 NOTE — Progress Notes (Signed)
   S:    PCP: Zelda   Patient arrives in good spirits. Presents to the clinic for BP check. Patient was referred and last seen by Primary Care Provider on 04/30/19. I saw him 06/23/19 - BP was 127/84 at that visit.   Patient reports adherence with medications.  Patient denies chest pain, shortness of breath, HA or blurred vision. Denies BLE edema.   Current BP Medications include:  Amlodipine 10 mg daily (missed several doses last week)  Dietary habits include: "I try to stay away from salt"; cut back on caffeine  Exercise habits include: has started PT and resumed lifting at the gym Family / Social history:  - FHx: MI (father), a fib (mother) - Tobacco: never smoker  - Alcohol: denies use   O:  L arm after 5 minutes rest: 132/87, HR 107 Home BP readings:  - Varies depending on time of day and anxiety level   Last 3 Office BP readings: BP Readings from Last 3 Encounters:  07/24/19 132/87  06/23/19 127/84  05/21/19 128/81   BMET    Component Value Date/Time   NA 139 04/30/2019 1523   K 4.2 04/30/2019 1523   CL 99 04/30/2019 1523   CO2 21 04/30/2019 1523   GLUCOSE 87 04/30/2019 1523   GLUCOSE 105 (H) 11/08/2018 1712   BUN 9 04/30/2019 1523   CREATININE 0.83 04/30/2019 1523   CREATININE 1.06 09/30/2012 1230   CALCIUM 9.2 04/30/2019 1523   GFRNONAA 121 04/30/2019 1523   GFRAA 139 04/30/2019 1523    Renal function: CrCl cannot be calculated (Patient's most recent lab result is older than the maximum 21 days allowed.).  Clinical ASCVD: No  The ASCVD Risk score Mikey Bussing DC Jr., et al., 2013) failed to calculate for the following reasons:   The 2013 ASCVD risk score is only valid for ages 66 to 17  A/P: Hypertension longstanding above goal  on current medications. BP Goal = <130/80 mmHg. Patient is adherent with current medications but missed several doses last week. Additionally, he reports an increase in stress which could certainly contribute to elevations in BP. Will not  make any changes at this point - pt will f/u with PCP next month.  -Continued amlodipine 10 mg daily.  -Counseled on lifestyle modifications for blood pressure control including reduced dietary sodium, increased exercise, adequate sleep  Results reviewed and written information provided. Total time in face-to-face counseling 10 minutes.   F/U Clinic Visit in 1 month for recheck.    Benard Halsted, PharmD, [place] [phone]   .

## 2019-08-05 ENCOUNTER — Encounter: Payer: Self-pay | Admitting: Physical Therapy

## 2019-08-05 ENCOUNTER — Ambulatory Visit: Payer: Medicaid Other | Attending: Nurse Practitioner | Admitting: Physical Therapy

## 2019-08-05 ENCOUNTER — Other Ambulatory Visit: Payer: Self-pay

## 2019-08-05 DIAGNOSIS — M6281 Muscle weakness (generalized): Secondary | ICD-10-CM | POA: Insufficient documentation

## 2019-08-05 DIAGNOSIS — M79604 Pain in right leg: Secondary | ICD-10-CM | POA: Insufficient documentation

## 2019-08-05 DIAGNOSIS — R2689 Other abnormalities of gait and mobility: Secondary | ICD-10-CM | POA: Diagnosis not present

## 2019-08-05 DIAGNOSIS — M79605 Pain in left leg: Secondary | ICD-10-CM | POA: Insufficient documentation
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
## 2019-08-07 ENCOUNTER — Encounter: Payer: Self-pay | Admitting: Physical Therapy

## 2019-08-07 ENCOUNTER — Ambulatory Visit: Payer: Medicaid Other | Admitting: Physical Therapy

## 2019-08-07 ENCOUNTER — Other Ambulatory Visit: Payer: Self-pay

## 2019-08-07 DIAGNOSIS — M79605 Pain in left leg: Secondary | ICD-10-CM

## 2019-08-07 DIAGNOSIS — R2689 Other abnormalities of gait and mobility: Secondary | ICD-10-CM | POA: Diagnosis not present

## 2019-08-07 DIAGNOSIS — M79604 Pain in right leg: Secondary | ICD-10-CM

## 2019-08-07 DIAGNOSIS — M6281 Muscle weakness (generalized): Secondary | ICD-10-CM | POA: Diagnosis not present
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
## 2019-08-12 ENCOUNTER — Other Ambulatory Visit: Payer: Self-pay

## 2019-08-12 ENCOUNTER — Encounter: Payer: Self-pay | Admitting: Physical Therapy

## 2019-08-12 ENCOUNTER — Other Ambulatory Visit: Payer: Self-pay | Admitting: Nurse Practitioner

## 2019-08-12 ENCOUNTER — Ambulatory Visit: Payer: Medicaid Other | Admitting: Physical Therapy

## 2019-08-12 DIAGNOSIS — M79604 Pain in right leg: Secondary | ICD-10-CM

## 2019-08-12 DIAGNOSIS — F419 Anxiety disorder, unspecified: Secondary | ICD-10-CM

## 2019-08-12 DIAGNOSIS — R2689 Other abnormalities of gait and mobility: Secondary | ICD-10-CM

## 2019-08-12 DIAGNOSIS — M6281 Muscle weakness (generalized): Secondary | ICD-10-CM

## 2019-08-12 DIAGNOSIS — M79605 Pain in left leg: Secondary | ICD-10-CM

## 2019-08-12 DIAGNOSIS — F329 Major depressive disorder, single episode, unspecified: Secondary | ICD-10-CM
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
## 2019-08-18 ENCOUNTER — Other Ambulatory Visit (HOSPITAL_COMMUNITY)
Admission: RE | Admit: 2019-08-18 | Discharge: 2019-08-18 | Disposition: A | Discharge status: Home / Self Care | Payer: Medicaid Other | Source: Ambulatory Visit | Attending: Nurse Practitioner | Admitting: Nurse Practitioner

## 2019-08-18 ENCOUNTER — Ambulatory Visit: Payer: Medicaid Other | Attending: Nurse Practitioner | Admitting: Nurse Practitioner

## 2019-08-18 ENCOUNTER — Other Ambulatory Visit: Payer: Self-pay

## 2019-08-18 ENCOUNTER — Encounter: Payer: Self-pay | Admitting: Nurse Practitioner

## 2019-08-18 VITALS — BP 137/75 | HR 72 | Temp 98.5°F | Ht 71.0 in | Wt 219.0 lb

## 2019-08-18 DIAGNOSIS — Z202 Contact with and (suspected) exposure to infections with a predominantly sexual mode of transmission: Secondary | ICD-10-CM | POA: Insufficient documentation

## 2019-08-18 DIAGNOSIS — Z79899 Other long term (current) drug therapy: Secondary | ICD-10-CM | POA: Insufficient documentation

## 2019-08-18 DIAGNOSIS — Z113 Encounter for screening for infections with a predominantly sexual mode of transmission: Secondary | ICD-10-CM | POA: Diagnosis not present

## 2019-08-18 DIAGNOSIS — I10 Essential (primary) hypertension: Secondary | ICD-10-CM | POA: Insufficient documentation

## 2019-08-18 DIAGNOSIS — F329 Major depressive disorder, single episode, unspecified: Secondary | ICD-10-CM | POA: Insufficient documentation

## 2019-08-18 DIAGNOSIS — F419 Anxiety disorder, unspecified: Secondary | ICD-10-CM | POA: Insufficient documentation

## 2019-08-18 DIAGNOSIS — Z8249 Family history of ischemic heart disease and other diseases of the circulatory system: Secondary | ICD-10-CM | POA: Diagnosis not present

## 2019-08-18 MED ORDER — AZITHROMYCIN 500 MG PO TABS
500.0000 mg | ORAL_TABLET | Freq: Once | ORAL | Status: AC
  Administered 2019-08-18: 500 mg via ORAL

## 2019-08-18 MED ORDER — CEFTRIAXONE SODIUM 250 MG IJ SOLR
250.0000 mg | Freq: Once | INTRAMUSCULAR | Status: AC
  Administered 2019-08-18: 250 mg via INTRAMUSCULAR

## 2019-08-18 MED ORDER — HYDROXYZINE HCL 50 MG PO TABS: 50.0000 mg | ORAL_TABLET | Freq: Three times a day (TID) | ORAL | 1 refills | Status: AC | PRN

## 2019-08-18 MED ORDER — AZITHROMYCIN 500 MG PO TABS: 1000.0000 mg | ORAL_TABLET | Freq: Once | ORAL | Status: DC
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
## 2019-08-21 LAB — URINE CYTOLOGY ANCILLARY ONLY
Bacterial Vaginitis-Urine: NEGATIVE
Candida Urine: NEGATIVE
Chlamydia: NEGATIVE
Comment: NEGATIVE
Comment: NEGATIVE
Comment: NORMAL
Neisseria Gonorrhea: NEGATIVE
Trichomonas: NEGATIVE

## 2019-08-27 ENCOUNTER — Ambulatory Visit: Payer: Medicaid Other | Admitting: Physical Therapy

## 2019-08-29 ENCOUNTER — Ambulatory Visit: Payer: Medicaid Other | Admitting: Physical Therapy

## 2019-08-30 DIAGNOSIS — R509 Fever, unspecified: Secondary | ICD-10-CM | POA: Diagnosis not present

## 2019-08-30 DIAGNOSIS — J029 Acute pharyngitis, unspecified: Secondary | ICD-10-CM | POA: Diagnosis not present

## 2019-08-31 DIAGNOSIS — Z20828 Contact with and (suspected) exposure to other viral communicable diseases: Secondary | ICD-10-CM | POA: Diagnosis not present

## 2019-09-01 ENCOUNTER — Ambulatory Visit: Payer: Medicaid Other | Admitting: Pharmacist

## 2019-09-01 NOTE — Progress Notes (Deleted)
   S:    PCP: Zelda   Patient arrives in good spirits. Presents to the clinic for BP check. Patient was referred and last seen by Primary Care Provider on 08/18/19. BP was close to goal at 137/75 at that appointment.   Patient *** adherence with medications.  Patient *** chest pain, shortness of breath, HA or blurred vision. *** BLE edema.   Current BP Medications include:  Amlodipine 10 mg daily  Dietary habits include: "I try to stay away from salt"; cut back on caffeine  Exercise habits include: has started PT and resumed lifting at the gym Family / Social history:  - FHx: MI (father), a fib (mother) - Tobacco: never smoker  - Alcohol: denies use   O:  L arm after 5 minutes rest: ***, HR ***   Home BP readings:  - Varies depending on time of day and anxiety level   Last 3 Office BP readings: BP Readings from Last 3 Encounters:  08/18/19 137/75  07/24/19 132/87  06/23/19 127/84   BMET    Component Value Date/Time   NA 139 04/30/2019 1523   K 4.2 04/30/2019 1523   CL 99 04/30/2019 1523   CO2 21 04/30/2019 1523   GLUCOSE 87 04/30/2019 1523   GLUCOSE 105 (H) 11/08/2018 1712   BUN 9 04/30/2019 1523   CREATININE 0.83 04/30/2019 1523   CREATININE 1.06 09/30/2012 1230   CALCIUM 9.2 04/30/2019 1523   GFRNONAA 121 04/30/2019 1523   GFRAA 139 04/30/2019 1523    Renal function: CrCl cannot be calculated (Patient's most recent lab result is older than the maximum 21 days allowed.).  Clinical ASCVD: No  The ASCVD Risk score Mikey Bussing DC Jr., et al., 2013) failed to calculate for the following reasons:   The 2013 ASCVD risk score is only valid for ages 9 to 49  A/P: Hypertension longstanding *** on current medications. BP Goal = <130/80 mmHg. Patient *** adherent with current medications. -*** -Counseled on lifestyle modifications for blood pressure control including reduced dietary sodium, increased exercise, adequate sleep  Results reviewed and written information  provided. Total time in face-to-face counseling 10 minutes.   F/U Clinic Visit in 1 month for recheck.    Benard Halsted, PharmD, [place] [phone]   .

## 2019-09-02 ENCOUNTER — Ambulatory Visit: Payer: Medicaid Other | Admitting: Physical Therapy

## 2019-09-04 ENCOUNTER — Ambulatory Visit: Payer: Medicaid Other | Admitting: Physical Therapy

## 2019-09-06 DIAGNOSIS — H5213 Myopia, bilateral: Secondary | ICD-10-CM | POA: Diagnosis not present

## 2019-09-06 DIAGNOSIS — H1013 Acute atopic conjunctivitis, bilateral: Secondary | ICD-10-CM | POA: Diagnosis not present

## 2019-09-09 ENCOUNTER — Ambulatory Visit: Payer: Medicaid Other | Admitting: Physical Therapy

## 2019-09-11 ENCOUNTER — Other Ambulatory Visit: Payer: Self-pay

## 2019-09-11 ENCOUNTER — Encounter: Payer: Self-pay | Admitting: Physical Therapy

## 2019-09-11 ENCOUNTER — Ambulatory Visit: Payer: Medicaid Other | Attending: Nurse Practitioner | Admitting: Physical Therapy

## 2019-09-11 DIAGNOSIS — R2689 Other abnormalities of gait and mobility: Secondary | ICD-10-CM | POA: Insufficient documentation

## 2019-09-11 DIAGNOSIS — M79605 Pain in left leg: Secondary | ICD-10-CM

## 2019-09-11 DIAGNOSIS — M79604 Pain in right leg: Secondary | ICD-10-CM | POA: Insufficient documentation

## 2019-09-11 DIAGNOSIS — M6281 Muscle weakness (generalized): Secondary | ICD-10-CM | POA: Diagnosis not present
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
## 2019-09-16 ENCOUNTER — Ambulatory Visit: Payer: Medicaid Other | Admitting: Physical Therapy

## 2019-09-16 ENCOUNTER — Other Ambulatory Visit: Payer: Self-pay

## 2019-09-16 ENCOUNTER — Encounter: Payer: Self-pay | Admitting: Physical Therapy

## 2019-09-16 DIAGNOSIS — M79604 Pain in right leg: Secondary | ICD-10-CM | POA: Diagnosis not present

## 2019-09-16 DIAGNOSIS — M79605 Pain in left leg: Secondary | ICD-10-CM

## 2019-09-16 DIAGNOSIS — M6281 Muscle weakness (generalized): Secondary | ICD-10-CM | POA: Diagnosis not present

## 2019-09-16 DIAGNOSIS — R2689 Other abnormalities of gait and mobility: Secondary | ICD-10-CM | POA: Diagnosis not present
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
## 2019-09-18 ENCOUNTER — Ambulatory Visit: Payer: Medicaid Other | Admitting: Physical Therapy

## 2019-09-20 DIAGNOSIS — Z20828 Contact with and (suspected) exposure to other viral communicable diseases: Secondary | ICD-10-CM | POA: Diagnosis not present

## 2019-10-27 ENCOUNTER — Other Ambulatory Visit: Payer: Self-pay | Admitting: Family Medicine

## 2019-10-27 DIAGNOSIS — I10 Essential (primary) hypertension: Secondary | ICD-10-CM

## 2020-02-12 DIAGNOSIS — H04123 Dry eye syndrome of bilateral lacrimal glands: Secondary | ICD-10-CM | POA: Diagnosis not present

## 2020-04-07 ENCOUNTER — Encounter: Payer: Self-pay | Admitting: Nurse Practitioner

## 2020-04-07 ENCOUNTER — Ambulatory Visit: Payer: Medicaid Other | Attending: Nurse Practitioner | Admitting: Nurse Practitioner

## 2020-04-07 DIAGNOSIS — R110 Nausea: Secondary | ICD-10-CM | POA: Insufficient documentation

## 2020-04-07 DIAGNOSIS — R21 Rash and other nonspecific skin eruption: Secondary | ICD-10-CM | POA: Insufficient documentation

## 2020-04-07 DIAGNOSIS — Z130 Encounter for screening for diseases of the blood and blood-forming organs and certain disorders involving the immune mechanism: Secondary | ICD-10-CM | POA: Insufficient documentation

## 2020-04-07 DIAGNOSIS — I10 Essential (primary) hypertension: Secondary | ICD-10-CM | POA: Diagnosis not present

## 2020-04-07 DIAGNOSIS — E559 Vitamin D deficiency, unspecified: Secondary | ICD-10-CM | POA: Diagnosis not present

## 2020-04-07 DIAGNOSIS — L409 Psoriasis, unspecified: Secondary | ICD-10-CM | POA: Insufficient documentation

## 2020-04-07 DIAGNOSIS — R1011 Right upper quadrant pain: Secondary | ICD-10-CM | POA: Insufficient documentation

## 2020-04-07 DIAGNOSIS — R197 Diarrhea, unspecified: Secondary | ICD-10-CM | POA: Insufficient documentation

## 2020-04-07 MED ORDER — CLOBETASOL PROPIONATE 0.05 % EX LOTN: TOPICAL_LOTION | CUTANEOUS | 0 refills | Status: DC

## 2020-04-07 NOTE — Progress Notes (Addendum)
Virtual Visit via Telephone Note Due to national recommendations of social distancing due to Ha[place] 19, telehealth visit is felt to be most appropriate for this patient at this time.  I discussed the limitations, risks, security and privacy concerns of performing an evaluation and management service by telephone and the availability of in person appointments. I also discussed with the patient that there may be a patient responsible charge related to this service. The patient expressed understanding and agreed to proceed.    I connected with Clarene Critchley on 04/07/20  at   4:10 PM EDT  EDT by telephone and verified that I am speaking with the correct person using two identifiers.   Consent I discussed the limitations, risks, security and privacy concerns of performing an evaluation and management service by telephone and the availability of in person appointments. I also discussed with the patient that there may be a patient responsible charge related to this service. The patient expressed understanding and agreed to proceed.   [facility] of [facility]   [facility] Provider[facility]    Persons participating in Telemedicine visit: Geryl Rankins FNP-BC [place]    History of Present Illness: Telemedicine visit for: Skin Rash Onset several months ago. Generalized pruritus and rash seems to have spread to multiple areas of the body. States he was told before by a health care provider that an area on his knee was psoriasis and know he has similar rash throughout body. Using aquaphor, calamine lotion and an old steroidal cream he was prescribed .for a previous rash in the past. Will need a dermatology referral. I did recommend that he upload pictures of his rash to his mychart for viewing.   He was treated for scabies with permethrin last January.   PLEASE SEE MY CHART PHOTOS    Endorses occasional RUQ abdominal pain.  Takes Tums for heartburn symptoms. Aggravating factors to abdominal pain: lying down. Unrelated to food intake. Has experienced a few occasional episodes of diarrhea and nausea. Denies constipation but feels he has generally has some sort of "intestinal issues going on".     Past Medical History:  Diagnosis Date   ADHD    Anxiety    Complication of anesthesia    " difficulty staying asleep during a couple of surgeries "   Depression    Displaced comminuted fracture of shaft of left fibula, initial encounter for closed fracture 06/18/2018   Femur fracture, left ([place]) 06/18/2018   GERD (gastroesophageal reflux disease)    H/O seasonal allergies    Hypertension    Open fracture of proximal end of right tibia, type IIIB 06/18/2018   Scabies    Wears glasses     Past Surgical History:  Procedure Laterality Date   DRESSING CHANGE UNDER ANESTHESIA Left 06/20/2018   Procedure: DRESSING CHANGE UNDER ANESTHESIA LEFT LEG;  Surgeon: Altamese Bemidji, MD;  Location: [facility];  Service: Orthopedics;  Laterality: Left;   EXTERNAL FIXATION LEG Left 06/14/2018   Procedure: LOWER EXTREMITY KNEE SPANNING EX FIX;  Surgeon: Leandrew Koyanagi, MD;  Location: [facility];  Service: Orthopedics;  Laterality: Left;   FEMUR IM NAIL Left 06/17/2018   Procedure: INTRAMEDULLARY (IM) RETROGRADE FEMORAL NAILING;  Surgeon: Altamese , MD;  Location: [facility];  Service: Orthopedics;  Laterality: Left;   I & D EXTREMITY Right 06/14/2018   Procedure: IRRIGATION AND DEBRIDEMENT TIB FIB EX FIX AND WOUND VAC APPLICATION;  Surgeon: Frankey Shown  M, MD;  Location: [facility];  Service: Orthopedics;  Laterality: Right;   I & D EXTREMITY Right 06/17/2018   Procedure: IRRIGATION AND DEBRIDEMENT EXTREMITY;  Surgeon: Altamese Oceano, MD;  Location: [facility];  Service: Orthopedics;  Laterality: Right;   I & D EXTREMITY Right 06/20/2018   Procedure: IRRIGATION AND DEBRIDEMENT EXTREMITY R tibia;  Surgeon: Altamese Gratz, MD;  Location: [facility];   Service: Orthopedics;  Laterality: Right;   INTRAMEDULLARY (IM) NAIL INTERTROCHANTERIC Left 10/01/2018   Procedure: EXCHANGE LEFT TIBIAL NAIL;  Surgeon: Altamese Hand, MD;  Location: [facility];  Service: Orthopedics;  Laterality: Left;   LEG SURGERY     Both leg   OPEN REDUCTION INTERNAL FIXATION (ORIF) METACARPAL Right 03/02/2016   Procedure: OPEN REDUCTION INTERNAL FIXATION (ORIF) 5TH METACARPAL RIGHT LITTLE FINGER;  Surgeon: Ninetta Lights, MD;  Location: [facility];  Service: Orthopedics;  Laterality: Right;   ORIF TIBIA PLATEAU Right 06/17/2018   Procedure: OPEN REDUCTION INTERNAL FIXATION (ORIF) TIBIAL PLATEAU;  Surgeon: Altamese Kensington, MD;  Location: [facility];  Service: Orthopedics;  Laterality: Right;   TIBIA IM NAIL INSERTION Left 06/17/2018   Procedure: INTRAMEDULLARY (IM) NAIL TIBIAL;  Surgeon: Altamese , MD;  Location: [facility];  Service: Orthopedics;  Laterality: Left;   WISDOM TOOTH EXTRACTION      Family History  Problem Relation Age of Onset   Hypertension Other    Multiple sclerosis Mother    Heart attack Father    Stroke Father    Hypertension Father     Social History   Socioeconomic History   Marital status: Single    Spouse name: Not on file   Number of children: Not on file   Years of education: Not on file   Highest education level: Not on file  Occupational History   Not on file  Tobacco Use   Smoking status: Never Smoker   Smokeless tobacco: Never Used  Substance and Sexual Activity   Alcohol use: Yes    Comment: rare   Drug use: Never   Sexual activity: Yes  Other Topics Concern   Not on file  Social History Narrative   ** Merged History Encounter **       Social Determinants of Health   Financial Resource Strain:    Difficulty of Paying Living Expenses:   Food Insecurity:    Worried About Charity fundraiser in the Last Year:    Arboriculturist in the Last Year:   Transportation Needs:    Lexicographer (Medical):    Lack of Transportation (Non-Medical):   Physical Activity:    Days of Exercise per Week:    Minutes of Exercise per Session:   Stress:    Feeling of Stress :   Social Connections:    Frequency of Communication with Friends and Family:    Frequency of Social Gatherings with Friends and Family:    Attends Religious Services:    Active Member of Clubs or Organizations:    Attends Music therapist:    Marital Status:      Observations/Objective: Awake, alert and oriented x 3   Review of Systems  Constitutional: Negative for fever, malaise/fatigue and weight loss.  HENT: Negative.  Negative for nosebleeds.   Eyes: Negative.  Negative for blurred vision, double vision and photophobia.  Respiratory: Negative.  Negative for cough and shortness of breath.   Cardiovascular: Negative.  Negative for chest pain, palpitations and leg swelling.  Gastrointestinal: Positive for abdominal pain, diarrhea and nausea. Negative for blood in stool, constipation, heartburn, melena and vomiting.  Musculoskeletal: Negative.  Negative for myalgias.  Skin: Positive for itching and rash.  Neurological: Negative.  Negative for dizziness, focal weakness, seizures and headaches.  Psychiatric/Behavioral: Negative.  Negative for suicidal ideas.    Assessment and Plan: Santonio was seen today for psoriasis.  Diagnoses and all orders for this visit:  Rash and nonspecific skin eruption -     Clobetasol Propionate 0.05 % lotion; Apply topically 2 times per day -     Hemoglobin A1c; Future -     CBC; Future -     TSH; Future -     VITAMIN D 25 Hydroxy (Vit-D Deficiency, Fractures); Future  Vitamin D deficiency -     VITAMIN D 25 Hydroxy (Vit-D Deficiency, Fractures); Future  Essential hypertension -     CMP14+EGFR; Future  Screening for deficiency anemia -     CBC; Future     Follow Up Instructions Return in about 4 weeks (around 05/05/2020).     I  discussed the assessment and treatment plan with the patient. The patient was provided an opportunity to ask questions and all were answered. The patient agreed with the plan and demonstrated an understanding of the instructions.   The patient was advised to call back or seek an in-person evaluation if the symptoms worsen or if the condition fails to improve as anticipated.  I provided 16 minutes of non-face-to-face time during this encounter including median intraservice time, reviewing previous notes, labs, imaging, medications and explaining diagnosis and management.  Gildardo Pounds, FNP-BC

## 2020-04-08 ENCOUNTER — Other Ambulatory Visit: Payer: Self-pay

## 2020-04-08 ENCOUNTER — Ambulatory Visit: Payer: Medicaid Other | Attending: Nurse Practitioner

## 2020-04-08 ENCOUNTER — Encounter: Payer: Self-pay | Admitting: Nurse Practitioner

## 2020-04-08 DIAGNOSIS — I10 Essential (primary) hypertension: Secondary | ICD-10-CM | POA: Diagnosis not present

## 2020-04-08 DIAGNOSIS — Z130 Encounter for screening for diseases of the blood and blood-forming organs and certain disorders involving the immune mechanism: Secondary | ICD-10-CM

## 2020-04-08 DIAGNOSIS — R21 Rash and other nonspecific skin eruption: Secondary | ICD-10-CM | POA: Diagnosis not present

## 2020-04-08 DIAGNOSIS — E559 Vitamin D deficiency, unspecified: Secondary | ICD-10-CM

## 2020-04-09 ENCOUNTER — Telehealth: Payer: Self-pay

## 2020-04-09 LAB — CMP14+EGFR
ALT: 27 IU/L (ref 0–44)
AST: 31 IU/L (ref 0–40)
Albumin/Globulin Ratio: 1.5 (ref 1.2–2.2)
Albumin: 4.9 g/dL (ref 4.1–5.2)
Alkaline Phosphatase: 59 IU/L (ref 48–121)
BUN/Creatinine Ratio: 10 (ref 9–20)
BUN: 9 mg/dL (ref 6–20)
Bilirubin Total: 0.4 mg/dL (ref 0.0–1.2)
CO2: 20 mmol/L (ref 20–29)
Calcium: 9.8 mg/dL (ref 8.7–10.2)
Chloride: 100 mmol/L (ref 96–106)
Creatinine, Ser: 0.93 mg/dL (ref 0.76–1.27)
GFR calc Af Amer: 129 mL/min/{1.73_m2} (ref >59)
GFR calc non Af Amer: 111 mL/min/{1.73_m2} (ref >59)
Globulin, Total: 3.3 g/dL (ref 1.5–4.5)
Glucose: 92 mg/dL (ref 65–99)
Potassium: 4.1 mmol/L (ref 3.5–5.2)
Sodium: 139 mmol/L (ref 134–144)
Total Protein: 8.2 g/dL (ref 6.0–8.5)

## 2020-04-09 LAB — CBC
Hematocrit: 47.7 % (ref 37.5–51.0)
Hemoglobin: 16.4 g/dL (ref 13.0–17.7)
MCH: 31.6 pg (ref 26.6–33.0)
MCHC: 34.4 g/dL (ref 31.5–35.7)
MCV: 92 fL (ref 79–97)
Platelets: 306 10*3/uL (ref 150–450)
RBC: 5.19 x10E6/uL (ref 4.14–5.80)
RDW: 12.9 % (ref 11.6–15.4)
WBC: 8.5 10*3/uL (ref 3.4–10.8)

## 2020-04-09 LAB — TSH: TSH: 1.4 u[IU]/mL (ref 0.450–4.500)

## 2020-04-09 LAB — HEMOGLOBIN A1C
Est. average glucose Bld gHb Est-mCnc: 91 mg/dL
Hgb A1c MFr Bld: 4.8 % (ref 4.8–5.6)

## 2020-04-09 LAB — VITAMIN D 25 HYDROXY (VIT D DEFICIENCY, FRACTURES): Vit D, 25-Hydroxy: 11.2 ng/mL — ABNORMAL LOW (ref 30.0–100.0)

## 2020-04-09 NOTE — Telephone Encounter (Signed)
If appropriate can you please change and resend to pharmacy a script for Clobetasol cream,gel, or ointment.  The Lotion is not covered under his Medicaid ins.

## 2020-04-11 ENCOUNTER — Other Ambulatory Visit: Payer: Self-pay | Admitting: Nurse Practitioner

## 2020-04-11 MED ORDER — CLOBETASOL PROPIONATE 0.05 % EX CREA: 1.0000 "application " | TOPICAL_CREAM | Freq: Two times a day (BID) | CUTANEOUS | 0 refills | Status: DC

## 2020-04-11 MED ORDER — VITAMIN D (ERGOCALCIFEROL) 1.25 MG (50000 UNIT) PO CAPS: 50000.0000 [IU] | ORAL_CAPSULE | ORAL | 1 refills | Status: AC

## 2020-04-18 ENCOUNTER — Other Ambulatory Visit: Payer: Self-pay | Admitting: Family Medicine

## 2020-04-18 DIAGNOSIS — I10 Essential (primary) hypertension: Secondary | ICD-10-CM

## 2020-05-12 ENCOUNTER — Ambulatory Visit: Payer: Medicaid Other | Admitting: Nurse Practitioner

## 2020-06-16 ENCOUNTER — Ambulatory Visit: Payer: Medicaid Other | Attending: Nurse Practitioner | Admitting: Nurse Practitioner

## 2020-06-16 ENCOUNTER — Encounter: Payer: Self-pay | Admitting: Nurse Practitioner

## 2020-06-16 ENCOUNTER — Other Ambulatory Visit: Payer: Self-pay

## 2020-06-16 ENCOUNTER — Ambulatory Visit: Payer: Medicaid Other | Admitting: Nurse Practitioner

## 2020-06-16 VITALS — BP 133/74 | HR 78 | Temp 97.7°F | Wt 218.0 lb

## 2020-06-16 DIAGNOSIS — F32A Depression, unspecified: Secondary | ICD-10-CM

## 2020-06-16 DIAGNOSIS — F329 Major depressive disorder, single episode, unspecified: Secondary | ICD-10-CM | POA: Diagnosis not present

## 2020-06-16 DIAGNOSIS — E559 Vitamin D deficiency, unspecified: Secondary | ICD-10-CM

## 2020-06-16 DIAGNOSIS — F419 Anxiety disorder, unspecified: Secondary | ICD-10-CM

## 2020-06-16 DIAGNOSIS — I10 Essential (primary) hypertension: Secondary | ICD-10-CM

## 2020-06-16 DIAGNOSIS — L409 Psoriasis, unspecified: Secondary | ICD-10-CM | POA: Diagnosis not present

## 2020-06-16 MED ORDER — SERTRALINE HCL 100 MG PO TABS: 100.0000 mg | ORAL_TABLET | Freq: Every day | ORAL | 1 refills | Status: DC

## 2020-06-16 MED ORDER — AMLODIPINE BESYLATE 10 MG PO TABS: 10.0000 mg | ORAL_TABLET | Freq: Every day | ORAL | 1 refills | Status: DC

## 2020-06-16 MED ORDER — CLOBETASOL PROPIONATE 0.05 % EX CREA: 1.0000 "application " | TOPICAL_CREAM | Freq: Two times a day (BID) | CUTANEOUS | 2 refills | Status: AC

## 2020-06-16 NOTE — Progress Notes (Signed)
Assessment & Plan:  Noah Duke was seen today for blood pressure check.  Diagnoses and all orders for this visit:  Essential hypertension -     amLODipine (NORVASC) 10 MG tablet; Take 1 tablet (10 mg total) by mouth daily. Continue all antihypertensives as prescribed.  Remember to bring in your blood pressure log with you for your follow up appointment.  DASH/Mediterranean Diets are healthier choices for HTN.   Vitamin D deficiency disease -     VITAMIN D 25 Hydroxy (Vit-D Deficiency, Fractures)  Anxiety and depression -     sertraline (ZOLOFT) 100 MG tablet; Take 1 tablet (100 mg total) by mouth daily.  Psoriasis -     clobetasol cream (TEMOVATE) 0.05 %; Apply 1 application topically 2 (two) times daily.    Patient has been counseled on age-appropriate routine health concerns for screening and prevention. These are reviewed and up-to-date. Referrals have been placed accordingly. Immunizations are up-to-date or declined.    Subjective:   Chief Complaint  Patient presents with  . Blood Pressure Check    Pt. is here for blood pressure check.    HPI Noah Duke 29 y.o. male presents to office today for blood pressure check.   Essential Hypertension Blood pressure is well controlled. Denies chest pain, shortness of breath, palpitations, lightheadedness, dizziness, headaches or BLE edema.  BP Readings from Last 3 Encounters:  06/16/20 133/74  08/18/19 137/75  07/24/19 132/87    Review of Systems  Constitutional: Negative for fever, malaise/fatigue and weight loss.  HENT: Negative.  Negative for nosebleeds.   Eyes: Negative.  Negative for blurred vision, double vision and photophobia.  Respiratory: Negative.  Negative for cough and shortness of breath.   Cardiovascular: Negative.  Negative for chest pain, palpitations and leg swelling.  Gastrointestinal: Negative.  Negative for heartburn, nausea and vomiting.  Musculoskeletal: Negative.  Negative for myalgias.  Skin:  Positive for itching and rash.  Neurological: Negative.  Negative for dizziness, focal weakness, seizures and headaches.  Psychiatric/Behavioral: Positive for depression. Negative for suicidal ideas. The patient is nervous/anxious.     Past Medical History:  Diagnosis Date  . ADHD   . Anxiety   . Complication of anesthesia    " difficulty staying asleep during a couple of surgeries "  . Depression   . Displaced comminuted fracture of shaft of left fibula, initial encounter for closed fracture 06/18/2018  . Femur fracture, left ([facility]) 06/18/2018  . GERD (gastroesophageal reflux disease)   . H/O seasonal allergies   . Hypertension   . Open fracture of proximal end of right tibia, type IIIB 06/18/2018  . Scabies   . Wears glasses     Past Surgical History:  Procedure Laterality Date  . DRESSING CHANGE UNDER ANESTHESIA Left 06/20/2018   Procedure: DRESSING CHANGE UNDER ANESTHESIA LEFT LEG;  Surgeon: Myrene Galas, MD;  [facility];  Service: Orthopedics;  Laterality: Left;  . EXTERNAL FIXATION LEG Left 06/14/2018   Procedure: LOWER EXTREMITY KNEE SPANNING EX FIX;  Surgeon: Tarry Kos, MD;  [facility];  Service: Orthopedics;  Laterality: Left;  . FEMUR IM NAIL Left 06/17/2018   Procedure: INTRAMEDULLARY (IM) RETROGRADE FEMORAL NAILING;  Surgeon: Myrene Galas, MD;  [facility];  Service: Orthopedics;  Laterality: Left;  . I & D EXTREMITY Right 06/14/2018   Procedure: IRRIGATION AND DEBRIDEMENT TIB FIB EX FIX AND WOUND VAC APPLICATION;  Surgeon: Tarry Kos, MD;  [facility];  Service: Orthopedics;  Laterality: Right;  .  I & D EXTREMITY Right 06/17/2018   Procedure: IRRIGATION AND DEBRIDEMENT EXTREMITY;  Surgeon: Myrene Galas, MD;  Location: [facility] OR;  Service: Orthopedics;  Laterality: Right;  . I & D EXTREMITY Right 06/20/2018   Procedure: IRRIGATION AND DEBRIDEMENT EXTREMITY R tibia;  Surgeon: Myrene Galas, MD;  Location: [facility];  Service: Orthopedics;  Laterality:  Right;  . INTRAMEDULLARY (IM) NAIL INTERTROCHANTERIC Left 10/01/2018   Procedure: EXCHANGE LEFT TIBIAL NAIL;  Surgeon: Myrene Galas, MD;  [facility];  Service: Orthopedics;  Laterality: Left;  . LEG SURGERY     Both leg  . OPEN REDUCTION INTERNAL FIXATION (ORIF) METACARPAL Right 03/02/2016   Procedure: OPEN REDUCTION INTERNAL FIXATION (ORIF) 5TH METACARPAL RIGHT LITTLE FINGER;  Surgeon: Loreta Ave, MD;  Location: [facility];  Service: Orthopedics;  Laterality: Right;  . ORIF TIBIA PLATEAU Right 06/17/2018   Procedure: OPEN REDUCTION INTERNAL FIXATION (ORIF) TIBIAL PLATEAU;  Surgeon: Myrene Galas, MD;  [facility];  Service: Orthopedics;  Laterality: Right;  . TIBIA IM NAIL INSERTION Left 06/17/2018   Procedure: INTRAMEDULLARY (IM) NAIL TIBIAL;  Surgeon: Myrene Galas, MD;  [facility];  Service: Orthopedics;  Laterality: Left;  . WISDOM TOOTH EXTRACTION      Family History  Problem Relation Age of Onset  . Hypertension Other   . Multiple sclerosis Mother   . Heart attack Father   . Stroke Father   . Hypertension Father     Social History Reviewed with no changes to be made today.   Outpatient Medications Prior to Visit  Medication Sig Dispense Refill  . calamine lotion Apply 1 application topically as needed for itching.    . Vitamin D, Ergocalciferol, (DRISDOL) 1.25 MG (50000 UNIT) CAPS capsule Take 1 capsule (50,000 Units total) by mouth every 7 (seven) days. 12 capsule 1  . amLODipine (NORVASC) 10 MG tablet TAKE 1 TABLET BY MOUTH EVERY DAY 90 tablet 0  . clobetasol cream (TEMOVATE) 0.05 % Apply 1 application topically 2 (two) times daily. 100 g 0  . sertraline (ZOLOFT) 100 MG tablet TAKE 1 TABLET BY MOUTH EVERY DAY 90 tablet 0  . calcium carbonate (OS-CAL - DOSED IN MG OF ELEMENTAL CALCIUM) 1250 (500 Ca) MG tablet Take 1 tablet (500 mg of elemental calcium total) by mouth 2 (two) times daily with a meal. (Patient not taking: Reported on 08/18/2019)      No facility-administered medications prior to visit.    Allergies  Allergen Reactions  . Other     cats  . Poison Ivy Extract        Objective:    BP 133/74 (BP Location: Left Arm, Patient Position: Sitting, Cuff Size: Large)   Pulse 78   Temp 97.7 F (36.5 C) (Temporal)   Wt 218 lb (98.9 kg)   SpO2 98%   BMI 30.40 kg/m  Wt Readings from Last 3 Encounters:  06/16/20 218 lb (98.9 kg)  08/18/19 219 lb (99.3 kg)  04/30/19 202 lb 6.4 oz (91.8 kg)    Physical Exam Vitals and nursing note reviewed.  Constitutional:      Appearance: He is well-developed.  HENT:     Head: Normocephalic and atraumatic.  Cardiovascular:     Rate and Rhythm: Normal rate and regular rhythm.     Heart sounds: Normal heart sounds. No murmur heard.  No friction rub. No gallop.   Pulmonary:     Effort: Pulmonary effort is normal. No tachypnea or respiratory distress.  Breath sounds: Normal breath sounds. No decreased breath sounds, wheezing, rhonchi or rales.  Chest:     Chest wall: No tenderness.  Abdominal:     General: Bowel sounds are normal.     Palpations: Abdomen is soft.  Musculoskeletal:        General: Normal range of motion.     Cervical back: Normal range of motion.  Skin:    General: Skin is warm and dry.     Findings: Rash present. No lesion. Rash is macular.       Neurological:     Mental Status: He is alert and oriented to person, place, and time.     Coordination: Coordination normal.  Psychiatric:        Behavior: Behavior normal. Behavior is cooperative.        Thought Content: Thought content normal.        Judgment: Judgment normal.          Patient has been counseled extensively about nutrition and exercise as well as the importance of adherence with medications and regular follow-up. The patient was given clear instructions to go to ER or return to medical center if symptoms don't improve, worsen or new problems develop. The patient verbalized  understanding.   Follow-up: Return in about 6 months (around 12/17/2020).   Claiborne Rigg, FNP-BC [facility] [place], [place] [phone]   06/16/2020, 5:36 PM

## 2020-06-17 LAB — VITAMIN D 25 HYDROXY (VIT D DEFICIENCY, FRACTURES): Vit D, 25-Hydroxy: 22.8 ng/mL — ABNORMAL LOW (ref 30.0–100.0)

## 2020-11-17 DIAGNOSIS — Z1159 Encounter for screening for other viral diseases: Secondary | ICD-10-CM | POA: Diagnosis not present

## 2020-11-17 DIAGNOSIS — Z114 Encounter for screening for human immunodeficiency virus [HIV]: Secondary | ICD-10-CM | POA: Diagnosis not present

## 2020-11-17 DIAGNOSIS — Z20822 Contact with and (suspected) exposure to covid-19: Secondary | ICD-10-CM | POA: Diagnosis not present

## 2020-11-17 DIAGNOSIS — R519 Headache, unspecified: Secondary | ICD-10-CM | POA: Diagnosis not present

## 2020-11-17 DIAGNOSIS — Z7251 High risk heterosexual behavior: Secondary | ICD-10-CM | POA: Diagnosis not present

## 2021-02-02 ENCOUNTER — Other Ambulatory Visit: Payer: Self-pay | Admitting: Nurse Practitioner

## 2021-02-02 DIAGNOSIS — I10 Essential (primary) hypertension: Secondary | ICD-10-CM

## 2021-02-02 DIAGNOSIS — F419 Anxiety disorder, unspecified: Secondary | ICD-10-CM

## 2021-02-02 MED ORDER — SERTRALINE HCL 100 MG PO TABS
100.0000 mg | ORAL_TABLET | Freq: Every day | ORAL | 0 refills | Status: AC
Start: 2021-02-02 — End: ?

## 2021-02-02 MED ORDER — AMLODIPINE BESYLATE 10 MG PO TABS: 10.0000 mg | ORAL_TABLET | Freq: Every day | ORAL | 0 refills | Status: AC

## 2021-02-02 NOTE — Telephone Encounter (Signed)
Medication Refill - Medication: sertraline (ZOLOFT) 100 MG tablet   Patient's mom stated that he forgot his medication where he is and is all out of medication.   Preferred Pharmacy (with phone number or street name):  [id number] - [place], [place] - [address] Phone:  [phone]  Fax:  [phone]       Agent: Please be advised that RX refills may take up to 3 business days. We ask that you follow-up with your pharmacy.

## 2021-02-02 NOTE — Telephone Encounter (Addendum)
Refill request for Setraline last filled 06/16/20; no valid encounter within last 6 months; no upcoming visits noted; Attempted to contact pt; message and spoke with his mother Elnita Maxwell; [place] informed his mother that a 30-day courtesy refill granted but he will need a visit for additional refills; the pt's mother inquired about his amlodipine refill; she was notified a 30-day courtesy refill was also sent for this medication and a visit will be required; she verbalized understanding. Requested Prescriptions  Signed Prescriptions Disp Refills   sertraline (ZOLOFT) 100 MG tablet 30 tablet 0    Sig: Take 1 tablet (100 mg total) by mouth daily.     Psychiatry:  Antidepressants - SSRI Failed - 02/02/2021  2:04 PM      Failed - Valid encounter within last 6 months    Recent Outpatient Visits          7 months ago Essential hypertension   [facility] [place], Shea Stakes, NP   10 months ago Rash and nonspecific skin eruption   [facility] [place], Shea Stakes, NP   1 year ago Essential hypertension   [facility] [place], Shea Stakes, NP   1 year ago Essential hypertension   [facility] Drucilla Chalet, RPH-CPP   1 year ago Essential hypertension   [facility] Drucilla Chalet, RPH-CPP

## 2021-02-02 NOTE — Telephone Encounter (Signed)
Medication Refill - Medication: amLODipine (NORVASC) 10 MG tablet   Patient stated he forgot his meds. At home and does not have it with him at this time.    Preferred Pharmacy (with phone number or street name):  [id number] - [place], [place] - [address] Phone:  [phone]  Fax:  [phone]       Agent: Please be advised that RX refills may take up to 3 business days. We ask that you follow-up with your pharmacy.

## 2021-02-02 NOTE — Telephone Encounter (Addendum)
Refill request for Amlodipine last filled 06/16/20; no valid encounter within last 6 months; no upcoming visits noted; Attempted to contact pt; message states the mailbox is full; unable to leave message; 30 day courtesy refill granted. Requested Prescriptions  Pending Prescriptions Disp Refills  . amLODipine (NORVASC) 10 MG tablet 90 tablet 1    Sig: Take 1 tablet (10 mg total) by mouth daily.     Cardiovascular:  Calcium Channel Blockers Failed - 02/02/2021 12:25 PM      Failed - Valid encounter within last 6 months    Recent Outpatient Visits          7 months ago Essential hypertension   [facility] [place], Shea Stakes, NP   10 months ago Rash and nonspecific skin eruption   [facility] [address] [facility] [place], Shea Stakes, NP   1 year ago Essential hypertension   [facility] Claiborne Rigg, NP   1 year ago Essential hypertension   [facility] Drucilla Chalet, RPH-CPP   1 year ago Essential hypertension   [facility] [place], Cornelius Moras, RPH-CPP             Passed - Last BP in normal range    BP Readings from Last 1 Encounters:  06/16/20 133/74

## 2021-06-17 ENCOUNTER — Ambulatory Visit: Payer: Medicaid Other | Admitting: Nurse Practitioner

## 2021-11-01 DIAGNOSIS — Z113 Encounter for screening for infections with a predominantly sexual mode of transmission: Secondary | ICD-10-CM | POA: Diagnosis not present

## 2021-11-01 DIAGNOSIS — I10 Essential (primary) hypertension: Secondary | ICD-10-CM | POA: Diagnosis not present

## 2021-11-01 DIAGNOSIS — R0683 Snoring: Secondary | ICD-10-CM | POA: Diagnosis not present

## 2021-11-01 DIAGNOSIS — J45909 Unspecified asthma, uncomplicated: Secondary | ICD-10-CM | POA: Diagnosis not present

## 2021-11-01 DIAGNOSIS — F411 Generalized anxiety disorder: Secondary | ICD-10-CM | POA: Diagnosis not present

## 2021-11-01 DIAGNOSIS — F331 Major depressive disorder, recurrent, moderate: Secondary | ICD-10-CM | POA: Diagnosis not present

## 2021-11-01 DIAGNOSIS — J351 Hypertrophy of tonsils: Secondary | ICD-10-CM | POA: Diagnosis not present

## 2021-11-01 DIAGNOSIS — Z0000 Encounter for general adult medical examination without abnormal findings: Secondary | ICD-10-CM | POA: Diagnosis not present

## 2021-11-01 DIAGNOSIS — L409 Psoriasis, unspecified: Secondary | ICD-10-CM | POA: Diagnosis not present

## 2022-05-04 DIAGNOSIS — L409 Psoriasis, unspecified: Secondary | ICD-10-CM | POA: Diagnosis not present

## 2022-05-04 DIAGNOSIS — I10 Essential (primary) hypertension: Secondary | ICD-10-CM | POA: Diagnosis not present

## 2022-05-04 DIAGNOSIS — F411 Generalized anxiety disorder: Secondary | ICD-10-CM | POA: Diagnosis not present

## 2022-05-04 DIAGNOSIS — J351 Hypertrophy of tonsils: Secondary | ICD-10-CM | POA: Diagnosis not present

## 2022-05-04 DIAGNOSIS — J45909 Unspecified asthma, uncomplicated: Secondary | ICD-10-CM | POA: Diagnosis not present

## 2022-06-15 DIAGNOSIS — G4730 Sleep apnea, unspecified: Secondary | ICD-10-CM | POA: Diagnosis not present

## 2022-06-15 DIAGNOSIS — R0683 Snoring: Secondary | ICD-10-CM | POA: Diagnosis not present

## 2022-06-15 DIAGNOSIS — J351 Hypertrophy of tonsils: Secondary | ICD-10-CM | POA: Diagnosis not present

## 2022-08-04 ENCOUNTER — Emergency Department (HOSPITAL_COMMUNITY)
Admission: EM | Admit: 2022-08-04 | Discharge: 2022-08-04 | Attending: Emergency Medicine | Admitting: Emergency Medicine

## 2022-08-04 ENCOUNTER — Other Ambulatory Visit: Payer: Self-pay

## 2022-08-04 ENCOUNTER — Encounter (HOSPITAL_COMMUNITY): Payer: Self-pay

## 2022-08-04 ENCOUNTER — Emergency Department (HOSPITAL_COMMUNITY)

## 2022-08-04 DIAGNOSIS — Y92149 Unspecified place in prison as the place of occurrence of the external cause: Secondary | ICD-10-CM | POA: Diagnosis not present

## 2022-08-04 DIAGNOSIS — S61210A Laceration without foreign body of right index finger without damage to nail, initial encounter: Secondary | ICD-10-CM | POA: Diagnosis not present

## 2022-08-04 DIAGNOSIS — X58XXXA Exposure to other specified factors, initial encounter: Secondary | ICD-10-CM | POA: Diagnosis not present

## 2022-08-04 MED ORDER — LIDOCAINE HCL (PF) 1 % IJ SOLN
5.0000 mL | Freq: Once | INTRAMUSCULAR | Status: AC
  Administered 2022-08-04: 5 mL
  Filled 2022-08-04: qty 30

## 2022-08-04 MED ORDER — CEPHALEXIN 500 MG PO CAPS: 500.0000 mg | ORAL_CAPSULE | Freq: Three times a day (TID) | ORAL | 0 refills | Status: AC

## 2022-08-04 MED ORDER — KETOROLAC TROMETHAMINE 15 MG/ML IJ SOLN
15.0000 mg | Freq: Once | INTRAMUSCULAR | Status: AC
  Administered 2022-08-04: 15 mg via INTRAMUSCULAR
  Filled 2022-08-04: qty 1

## 2022-08-04 NOTE — ED Provider Notes (Signed)
[facility]-EMERGENCY DEPT Provider Note   CSN: [id number] Arrival date & time: 08/04/22  8299     History  Chief Complaint  Patient presents with   Finger Injury    CAI Noah Duke is a 31 y.o. male.  31 year old presents today for evaluation of laceration to his right index finger.  This occurred about 6 hours prior to arrival according to the officer at bedside.  Patient does not want to discuss how he sustained this injury.  He reports his tetanus shot is up-to-date within the past 5 years.  The history is provided by the patient. No language interpreter was used.       Home Medications Prior to Admission medications   Medication Sig Start Date End Date Taking? Authorizing Provider  amLODipine (NORVASC) 10 MG tablet Take 1 tablet (10 mg total) by mouth daily. 02/02/21   Claiborne Rigg, NP  calamine lotion Apply 1 application topically as needed for itching.    [provider]  clobetasol cream (TEMOVATE) 0.05 % Apply 1 application topically 2 (two) times daily. 06/16/20   Claiborne Rigg, NP  sertraline (ZOLOFT) 100 MG tablet Take 1 tablet (100 mg total) by mouth daily. 02/02/21   Claiborne Rigg, NP  Vitamin D, Ergocalciferol, (DRISDOL) 1.25 MG (50000 UNIT) CAPS capsule Take 1 capsule (50,000 Units total) by mouth every 7 (seven) days. 04/11/20   Claiborne Rigg, NP      Allergies    Other and Poison ivy extract    Review of Systems   Review of Systems  Constitutional:  Negative for chills and fever.  Skin:  Positive for wound.  All other systems reviewed and are negative.   Physical Exam Updated Vital Signs BP (!) 155/93 (BP Location: Left Arm)   Pulse 89   Temp 98 F (36.7 C) (Oral)   Resp 17   Ht 5\' 11"  (1.803 m)   Wt 93 kg   SpO2 97%   BMI 28.59 kg/m  Physical Exam Vitals and nursing note reviewed.  Constitutional:      General: He is not in acute distress.    Appearance: Normal appearance. He is not ill-appearing.   HENT:     Head: Normocephalic and atraumatic.     Nose: Nose normal.  Eyes:     Conjunctiva/sclera: Conjunctivae normal.  Pulmonary:     Effort: Pulmonary effort is normal. No respiratory distress.  Musculoskeletal:        General: No deformity. Normal range of motion.     Comments: 1.5 cm x 1.5 cm irregular laceration noted to dorsal aspect of right index finger.  Full range of motion including full extension, full flexion, and without surrounding erythema.  Neurovascularly intact.  Skin:    Findings: No rash.  Neurological:     Mental Status: He is alert.     ED Results / Procedures / Treatments   Labs (all labs ordered are listed, but only abnormal results are displayed) Labs Reviewed - No data to display  EKG None  Radiology DG Finger Index Right  Result Date: 08/04/2022 CLINICAL DATA:  Index finger laceration yesterday. EXAM: RIGHT INDEX FINGER 2+V COMPARISON:  Right hand series 03/14/2017 FINDINGS: Lateral view may demonstrate a dorsal skin laceration in the proximal index finger. There is mild swelling in the index finger. Underlying bones are unremarkable. Fracture fixation plating again is noted extending over the mid to distal fifth metacarpal. No other radiopaque foreign body. IMPRESSION: 1. Soft tissue  swelling without evidence of fractures. 2. Query skin laceration dorsally, proximal index finger. 3. Old fifth metacarpal fracture fixation plating. Electronically Signed   By: Almira Bar M.D.   On: 08/04/2022 04:34    Procedures .Marland KitchenLaceration Repair  Date/Time: 08/04/2022 5:54 AM  Performed by: Marita Kansas, PA-C Authorized by: Marita Kansas, PA-C   Consent:    Consent obtained:  Verbal   Consent given by:  Patient   Risks discussed:  Infection, need for additional repair, pain, poor cosmetic result and poor wound healing   Alternatives discussed:  No treatment and delayed treatment Universal protocol:    Procedure explained and questions answered to patient or  proxy's satisfaction: yes     Relevant documents present and verified: yes     Test results available: yes     Imaging studies available: yes     Patient identity confirmed:  Verbally with patient Anesthesia:    Anesthesia method:  Local infiltration   Local anesthetic:  Lidocaine 1% w/o epi Laceration details:    Location:  Hand   Hand location:  R hand, dorsum   Length (cm):  1.5 Pre-procedure details:    Preparation:  Patient was prepped and draped in usual sterile fashion Exploration:    Limited defect created (wound extended): no     Imaging obtained: x-ray     Imaging outcome: foreign body not noted   Treatment:    Area cleansed with:  Povidone-iodine and saline   Amount of cleaning:  Extensive   Irrigation volume:  500   Irrigation method:  Syringe and tap   Debridement:  None   Undermining:  None Skin repair:    Repair method:  Sutures   Suture size:  5-0   Suture material:  Prolene   Suture technique:  Simple interrupted   Number of sutures:  4 Approximation:    Approximation:  Close Repair type:    Repair type:  Simple Post-procedure details:    Dressing:  Non-adherent dressing   Procedure completion:  Tolerated well, no immediate complications     Medications Ordered in ED Medications  lidocaine (PF) (XYLOCAINE) 1 % injection 5 mL (5 mLs Infiltration Given 08/04/22 0431)    ED Course/ Medical Decision Making/ A&P                           Medical Decision Making Amount and/or Complexity of Data Reviewed Radiology: ordered.  Risk Prescription drug management.   31 year old male presents today for evaluation of laceration.  He is presented with an Technical sales engineer.  This occurred prior to his arrest.  There was a 6-hour delay from arrest to time of presentation.  Without surrounding erythema, or fever.  Good range of motion.  X-ray without evidence of foreign body or underlying fracture.  Laceration repaired with 4 stitches.  Antibiotic provided given unknown  injury mechanism.  Return precaution discussed.  Patient voices understanding and is in agreement with plan.  Discharge instructions provided to bedside officer including a paper prescription for his antibiotics.  Discussed these will need to be removed in about 7 days.   Final Clinical Impression(s) / ED Diagnoses Final diagnoses:  Laceration of right index finger without foreign body without damage to nail, initial encounter    Rx / DC Orders ED Discharge Orders          Ordered    cephALEXin (KEFLEX) 500 MG capsule  3 times daily  08/04/22 [place], [place], PA-C 08/04/22 7096    Veryl Speak, MD 08/04/22 [phone]

## 2022-08-04 NOTE — ED Triage Notes (Signed)
BIB by [facility] from [facility] with laceration to R ring finger. States he doesn't know how he got lac. UTD with tetanus.

## 2022-08-04 NOTE — Discharge Instructions (Addendum)
Your laceration was repaired with 4 stitches.  These will need to be removed in about 7 days.  If you notice any signs of infection including surrounding redness, swelling, drainage from this area, or fever please return for evaluation.

## 2023-01-14 DIAGNOSIS — H5213 Myopia, bilateral: Secondary | ICD-10-CM | POA: Diagnosis not present

## 2023-01-31 ENCOUNTER — Encounter (HOSPITAL_BASED_OUTPATIENT_CLINIC_OR_DEPARTMENT_OTHER): Payer: Self-pay

## 2023-01-31 ENCOUNTER — Emergency Department (HOSPITAL_BASED_OUTPATIENT_CLINIC_OR_DEPARTMENT_OTHER)
Admission: EM | Admit: 2023-01-31 | Discharge: 2023-01-31 | Disposition: A | Discharge status: Home / Self Care | Payer: Medicaid Other | Attending: Emergency Medicine | Admitting: Emergency Medicine

## 2023-01-31 ENCOUNTER — Emergency Department (HOSPITAL_BASED_OUTPATIENT_CLINIC_OR_DEPARTMENT_OTHER): Payer: Medicaid Other | Admitting: Radiology

## 2023-01-31 ENCOUNTER — Other Ambulatory Visit: Payer: Self-pay

## 2023-01-31 DIAGNOSIS — I10 Essential (primary) hypertension: Secondary | ICD-10-CM | POA: Insufficient documentation

## 2023-01-31 DIAGNOSIS — Z79899 Other long term (current) drug therapy: Secondary | ICD-10-CM | POA: Insufficient documentation

## 2023-01-31 DIAGNOSIS — R079 Chest pain, unspecified: Secondary | ICD-10-CM | POA: Diagnosis present

## 2023-01-31 DIAGNOSIS — M940 Chondrocostal junction syndrome [Tietze]: Secondary | ICD-10-CM | POA: Insufficient documentation

## 2023-01-31 DIAGNOSIS — Q676 Pectus excavatum: Secondary | ICD-10-CM | POA: Diagnosis not present

## 2023-01-31 LAB — CBC
HCT: 45.2 % (ref 39.0–52.0)
Hemoglobin: 14.9 g/dL (ref 13.0–17.0)
MCH: 29.9 pg (ref 26.0–34.0)
MCHC: 33 g/dL (ref 30.0–36.0)
MCV: 90.8 fL (ref 80.0–100.0)
Platelets: 306 10*3/uL (ref 150–400)
RBC: 4.98 MIL/uL (ref 4.22–5.81)
RDW: 13.2 % (ref 11.5–15.5)
WBC: 5.3 10*3/uL (ref 4.0–10.5)
nRBC: 0 % (ref 0.0–0.2)

## 2023-01-31 LAB — BASIC METABOLIC PANEL
Anion gap: 11 (ref 5–15)
BUN: 12 mg/dL (ref 6–20)
CO2: 25 mmol/L (ref 22–32)
Calcium: 9.7 mg/dL (ref 8.9–10.3)
Chloride: 102 mmol/L (ref 98–111)
Creatinine, Ser: 0.9 mg/dL (ref 0.61–1.24)
GFR, Estimated: >60 mL/min (ref >60)
Glucose, Bld: 114 mg/dL — ABNORMAL HIGH (ref 70–99)
Potassium: 4.1 mmol/L (ref 3.5–5.1)
Sodium: 138 mmol/L (ref 135–145)

## 2023-01-31 LAB — TROPONIN I (HIGH SENSITIVITY): Troponin I (High Sensitivity): <2 ng/L (ref <18)

## 2023-01-31 MED ORDER — IBUPROFEN 600 MG PO TABS: 600.0000 mg | ORAL_TABLET | Freq: Three times a day (TID) | ORAL | 0 refills | Status: AC | PRN

## 2023-01-31 MED ORDER — ONDANSETRON 4 MG PO TBDP
4.0000 mg | ORAL_TABLET | Freq: Once | ORAL | Status: AC
  Administered 2023-01-31: 4 mg via ORAL
  Filled 2023-01-31: qty 1

## 2023-01-31 MED ORDER — MORPHINE SULFATE (PF) 4 MG/ML IV SOLN
4.0000 mg | Freq: Once | INTRAVENOUS | Status: AC
  Administered 2023-01-31: 4 mg via INTRAMUSCULAR
  Filled 2023-01-31: qty 1

## 2023-01-31 MED ORDER — CYCLOBENZAPRINE HCL 10 MG PO TABS
10.0000 mg | ORAL_TABLET | Freq: Once | ORAL | Status: AC
  Administered 2023-01-31: 10 mg via ORAL
  Filled 2023-01-31: qty 1

## 2023-01-31 MED ORDER — CYCLOBENZAPRINE HCL 10 MG PO TABS: 10.0000 mg | ORAL_TABLET | Freq: Two times a day (BID) | ORAL | 0 refills | Status: AC | PRN

## 2023-01-31 NOTE — ED Provider Notes (Signed)
[facility] Provider Note   CSN: [id number] Arrival date & time: 01/31/23  1526     History  Chief Complaint  Patient presents with   Chest Pain    Noah Duke is a 32 y.o. male with past medical history hypertension who presents to the ED complaining of left chest pain for the past week.  Patient reports that pain started after he was lifting weights and believes that he overdid his exercise regimen for that day and pain is related to muscle strain.  He describes pain as just to the left of the lower sternum and worse with taking a deep breath, coughing, and moving his left arm.  He denies associated shortness of breath, nausea, diarrhea, diaphoresis, dizziness, lightheadedness, syncope, abdominal pain, pain radiating to the neck or down the left arm.  He denies personal or family history of heart disease, arrhythmia, or early death.      Home Medications Prior to Admission medications   Medication Sig Start Date End Date Taking? Authorizing Provider  cyclobenzaprine (FLEXERIL) 10 MG tablet Take 1 tablet (10 mg total) by mouth 2 (two) times daily as needed for up to 5 days for muscle spasms. 01/31/23 02/05/23 Yes Brayley Mackowiak L, PA-C  ibuprofen (ADVIL) 600 MG tablet Take 1 tablet (600 mg total) by mouth every 8 (eight) hours as needed for up to 5 days for mild pain or moderate pain. 01/31/23 02/05/23 Yes Conley Delisle L, PA-C  amLODipine (NORVASC) 10 MG tablet Take 1 tablet (10 mg total) by mouth daily. 02/02/21   Gildardo Pounds, NP  calamine lotion Apply 1 application topically as needed for itching.    [provider]  clobetasol cream (TEMOVATE) [id number] % Apply 1 application topically 2 (two) times daily. 06/16/20   Gildardo Pounds, NP  sertraline (ZOLOFT) 100 MG tablet Take 1 tablet (100 mg total) by mouth daily. 02/02/21   Gildardo Pounds, NP  Vitamin D, Ergocalciferol, (DRISDOL) 1.25 MG (50000 UNIT) CAPS capsule Take 1 capsule (50,000  Units total) by mouth every 7 (seven) days. 04/11/20   Gildardo Pounds, NP      Allergies    Other and Poison ivy extract    Review of Systems   Review of Systems  All other systems reviewed and are negative.   Physical Exam Updated Vital Signs BP (!) 158/105 (BP Location: Right Arm)   Pulse 81   Temp 98.3 F (36.8 C) (Oral)   Resp 18   Ht 5\' 11"  (1.803 m)   Wt 81.6 kg   SpO2 99%   BMI 25.10 kg/m  Physical Exam Vitals and nursing note reviewed.  Constitutional:      General: He is not in acute distress.    Appearance: Normal appearance. He is not ill-appearing, toxic-appearing or diaphoretic.  HENT:     Head: Normocephalic and atraumatic.     Mouth/Throat:     Mouth: Mucous membranes are moist.  Eyes:     Conjunctiva/sclera: Conjunctivae normal.  Cardiovascular:     Rate and Rhythm: Normal rate and regular rhythm.     Heart sounds: Normal heart sounds. No murmur heard. Pulmonary:     Effort: Pulmonary effort is normal. No tachypnea, accessory muscle usage or respiratory distress.     Breath sounds: Normal breath sounds. No stridor. No decreased breath sounds, wheezing, rhonchi or rales.  Chest:     Chest wall: Deformity (pectus excavatum) and tenderness (diffusely across left chest  wall) present.  Abdominal:     General: Abdomen is flat.     Palpations: Abdomen is soft.     Tenderness: There is no abdominal tenderness.  Musculoskeletal:        General: Normal range of motion.     Cervical back: Normal range of motion and neck supple.     Right lower leg: No edema.     Left lower leg: No edema.  Skin:    General: Skin is warm and dry.     Capillary Refill: Capillary refill takes less than 2 seconds.  Neurological:     Mental Status: He is alert. Mental status is at baseline.  Psychiatric:        Behavior: Behavior normal.     ED Results / Procedures / Treatments   Labs (all labs ordered are listed, but only abnormal results are displayed) Labs Reviewed   BASIC METABOLIC PANEL - Abnormal; Notable for the following components:      Result Value   Glucose, Bld 114 (*)    All other components within normal limits  CBC  TROPONIN I (HIGH SENSITIVITY)    EKG Normal sinus rhythm at 84 bpm, no acute ST-T changes, normal intervals, no significant changes from previous tracing, no STEMI  Radiology DG Chest 2 View  Result Date: 01/31/2023 CLINICAL DATA:  Chest pain EXAM: CHEST - 2 VIEW COMPARISON:  X-ray 06/19/2018 FINDINGS: No consolidation, pneumothorax or effusion. Normal cardiopericardial silhouette without edema. Pectus excavatum on the lateral view. IMPRESSION: No acute cardiopulmonary disease.  Pectus excavatum Electronically Signed   By: Jill Side M.D.   On: 01/31/2023 15:56    Procedures Procedures    Medications Ordered in ED Medications  morphine (PF) 4 MG/ML injection 4 mg (4 mg Intramuscular Given 01/31/23 1638)  ondansetron (ZOFRAN-ODT) disintegrating tablet 4 mg (4 mg Oral Given 01/31/23 1638)  cyclobenzaprine (FLEXERIL) tablet 10 mg (10 mg Oral Given 01/31/23 1638)    ED Course/ Medical Decision Making/ A&P                             Medical Decision Making Amount and/or Complexity of Data Reviewed Labs: ordered. Decision-making details documented in ED Course. Radiology: ordered. Decision-making details documented in ED Course. ECG/medicine tests: ordered. Decision-making details documented in ED Course.  Risk Prescription drug management.   Medical Decision Making:   Noah Duke is a 32 y.o. male who presented to the ED today with chest pain detailed above.    Patient's presentation is complicated by their history of possible injury, hypertension.  Patient placed on continuous vitals and telemetry monitoring while in ED which was reviewed periodically.  Complete initial physical exam performed, notably the patient  was in no acute distress.  Lungs clear to auscultation.  He had diffuse tenderness across the left  chest wall that was worse with movement of the left upper extremity.    Reviewed and confirmed nursing documentation for past medical history, family history, social history.    Initial Assessment:   With the patient's presentation of chest pain, the emergent differential diagnosis of chest pain includes: Acute coronary syndrome, pericarditis, aortic dissection, pulmonary embolism, tension pneumothorax, and esophageal rupture. I do not believe the patient has an emergent cause of chest pain, other urgent/non-acute considerations include, but are not limited to: chronic angina, aortic stenosis, cardiomyopathy, myocarditis, mitral valve prolapse, pulmonary hypertension, hypertrophic obstructive cardiomyopathy (HOCM), aortic insufficiency, right ventricular hypertrophy, pneumonia,  pleuritis, bronchitis, pneumothorax, tumor, gastroesophageal reflux disease (GERD), esophageal spasm, Mallory-Weiss syndrome, peptic ulcer disease, biliary disease, pancreatitis, functional gastrointestinal pain, cervical or thoracic disk disease or arthritis, shoulder arthritis, costochondritis, subacromial bursitis, anxiety or panic attack, herpes zoster, breast disorders, chest wall tumors, thoracic outlet syndrome, mediastinitis.   This is most consistent with an acute complicated illness  Initial Plan:  Screening labs including CBC and Metabolic panel to evaluate for infectious or metabolic etiology of disease.  CXR to evaluate for structural/infectious intrathoracic pathology.  EKG and troponin to evaluate for cardiac pathology Pain management Objective evaluation as reviewed   Initial Study Results:   Laboratory  All laboratory results reviewed without evidence of clinically relevant pathology.    EKG EKG was reviewed independently. ST segments without concerns for elevations.   EKG: normal EKG, normal sinus rhythm, unchanged from previous tracings.   Radiology:  All images reviewed independently. Agree with  radiology report at this time.   DG Chest 2 View  Result Date: 01/31/2023 CLINICAL DATA:  Chest pain EXAM: CHEST - 2 VIEW COMPARISON:  X-ray 06/19/2018 FINDINGS: No consolidation, pneumothorax or effusion. Normal cardiopericardial silhouette without edema. Pectus excavatum on the lateral view. IMPRESSION: No acute cardiopulmonary disease.  Pectus excavatum Electronically Signed   By: Jill Side M.D.   On: 01/31/2023 15:56      Final Assessment and Plan:   This is a 32 year old male with a past medical history hypertension who presents to the ED complaining of left-sided chest pain for the last week.  He reports possible weight lifting injury.  Pain is worse with palpation of the chest and movement of the left arm as well as deep breathing and coughing.  History of pectus excavatum.  No shortness of breath, dizziness, lightheadedness, or other associated symptoms.  Regular rate and rhythm.  Lungs clear to auscultation.  No lower extremity edema or tenderness.  He is hypertensive but otherwise vital signs are within normal limits.  He reports adherence to his antihypertensives at home.  Workup obtained as above for further assessment.  Patient denies any change in the characteristics of his pain and it has been going on for a week now so do not see an indication for repeat troponin.  EKG normal sinus rhythm without acute ST-T changes and similar to previous.  Troponin negative.  Other workup as detailed above unremarkable.  Will discharge patient home with muscle relaxers for likely musculoskeletal related injury and have him follow-up with primary care as needed.  Strict ED return precautions given, all questions answered, and stable for discharge.   Clinical Impression:  1. Acute costochondritis      Discharge           Final Clinical Impression(s) / ED Diagnoses Final diagnoses:  Acute costochondritis    Rx / DC Orders ED Discharge Orders          Ordered    cyclobenzaprine  (FLEXERIL) 10 MG tablet  2 times daily PRN        01/31/23 1716    ibuprofen (ADVIL) 600 MG tablet  Every 8 hours PRN        01/31/23 1716              Suzzette Righter, PA-C 01/31/23 1717    Davonna Belling, MD 01/31/23 2334

## 2023-01-31 NOTE — Discharge Instructions (Addendum)
Thank you for letting us take care of you today.  Your workup today was negative including chest x-ray, EKG, blood work with cardiac enzymes.  I believe that your pain is related to a condition called costochondritis which we will treat with NSAIDs, muscle relaxers, and other conservative management discussed in attachment provided. You may take over the counter Tylenol in addition to these medications. Please take medications as prescribed.  For any continued symptoms, please follow-up with your PCP.  If you develop any new or worsening symptoms such as severe chest pain, shortness of breath, lightheadedness, dizziness, loss of consciousness, or other new, concerning symptoms, please return to the nearest emergency department for reevaluation.

## 2023-01-31 NOTE — ED Triage Notes (Signed)
Patient here POV from Home.  Endorses CP that began 1 Week ago. Worse with movement. States he believes it is from exercise but is unsure.   No SOB but more Painful with Deep Inspiration.  NAD Noted during Triage. A&Ox4. GCS 15. Ambulatory.

## 2023-06-18 DIAGNOSIS — F410 Panic disorder [episodic paroxysmal anxiety] without agoraphobia: Secondary | ICD-10-CM | POA: Diagnosis not present

## 2023-06-18 DIAGNOSIS — Z789 Other specified health status: Secondary | ICD-10-CM | POA: Diagnosis not present

## 2023-06-18 DIAGNOSIS — F411 Generalized anxiety disorder: Secondary | ICD-10-CM | POA: Diagnosis not present

## 2023-06-18 DIAGNOSIS — G8929 Other chronic pain: Secondary | ICD-10-CM | POA: Diagnosis not present

## 2023-06-18 DIAGNOSIS — Z8781 Personal history of (healed) traumatic fracture: Secondary | ICD-10-CM | POA: Diagnosis not present

## 2023-06-18 DIAGNOSIS — M79605 Pain in left leg: Secondary | ICD-10-CM | POA: Diagnosis not present

## 2023-06-18 DIAGNOSIS — I10 Essential (primary) hypertension: Secondary | ICD-10-CM | POA: Diagnosis not present

## 2023-06-18 DIAGNOSIS — M79604 Pain in right leg: Secondary | ICD-10-CM | POA: Diagnosis not present

## 2023-06-18 DIAGNOSIS — Z0000 Encounter for general adult medical examination without abnormal findings: Secondary | ICD-10-CM | POA: Diagnosis not present

## 2023-08-01 DIAGNOSIS — I10 Essential (primary) hypertension: Secondary | ICD-10-CM | POA: Diagnosis not present

## 2023-08-01 DIAGNOSIS — R059 Cough, unspecified: Secondary | ICD-10-CM | POA: Diagnosis not present

## 2023-08-01 DIAGNOSIS — J029 Acute pharyngitis, unspecified: Secondary | ICD-10-CM | POA: Diagnosis not present

## 2023-08-01 DIAGNOSIS — Z20822 Contact with and (suspected) exposure to covid-19: Secondary | ICD-10-CM | POA: Diagnosis not present

## 2023-08-13 ENCOUNTER — Encounter (HOSPITAL_BASED_OUTPATIENT_CLINIC_OR_DEPARTMENT_OTHER): Payer: Self-pay

## 2023-08-13 ENCOUNTER — Emergency Department (HOSPITAL_BASED_OUTPATIENT_CLINIC_OR_DEPARTMENT_OTHER)
Admission: EM | Admit: 2023-08-13 | Discharge: 2023-08-13 | Disposition: A | Discharge status: Home / Self Care | Payer: Medicaid Other | Attending: Emergency Medicine | Admitting: Emergency Medicine

## 2023-08-13 ENCOUNTER — Emergency Department (HOSPITAL_BASED_OUTPATIENT_CLINIC_OR_DEPARTMENT_OTHER): Payer: Medicaid Other | Admitting: Radiology

## 2023-08-13 ENCOUNTER — Other Ambulatory Visit: Payer: Self-pay

## 2023-08-13 DIAGNOSIS — Z20822 Contact with and (suspected) exposure to covid-19: Secondary | ICD-10-CM | POA: Insufficient documentation

## 2023-08-13 DIAGNOSIS — J189 Pneumonia, unspecified organism: Secondary | ICD-10-CM

## 2023-08-13 DIAGNOSIS — J168 Pneumonia due to other specified infectious organisms: Secondary | ICD-10-CM | POA: Diagnosis not present

## 2023-08-13 DIAGNOSIS — J181 Lobar pneumonia, unspecified organism: Secondary | ICD-10-CM | POA: Insufficient documentation

## 2023-08-13 DIAGNOSIS — R059 Cough, unspecified: Secondary | ICD-10-CM | POA: Diagnosis present

## 2023-08-13 DIAGNOSIS — R918 Other nonspecific abnormal finding of lung field: Secondary | ICD-10-CM | POA: Diagnosis not present

## 2023-08-13 DIAGNOSIS — R0982 Postnasal drip: Secondary | ICD-10-CM | POA: Diagnosis not present

## 2023-08-13 LAB — RESP PANEL BY RT-PCR (RSV, FLU A&B, COVID)  RVPGX2
Influenza A by PCR: NEGATIVE
Influenza B by PCR: NEGATIVE
Resp Syncytial Virus by PCR: NEGATIVE
SARS Coronavirus 2 by RT PCR: NEGATIVE

## 2023-08-13 MED ORDER — DOXYCYCLINE HYCLATE 100 MG PO CAPS: 100.0000 mg | ORAL_CAPSULE | Freq: Two times a day (BID) | ORAL | 0 refills | Status: AC

## 2023-08-13 MED ORDER — DOXYCYCLINE HYCLATE 100 MG PO TABS
100.0000 mg | ORAL_TABLET | Freq: Once | ORAL | Status: AC
  Administered 2023-08-13: 100 mg via ORAL
  Filled 2023-08-13: qty 1

## 2023-08-13 NOTE — Discharge Instructions (Signed)
Today your chest x-ray shows that you have pneumonia in your left upper lobe and you are given 1 dose of antibiotics however you will need to pick up the rest of your antibiotics at your pharmacy.  Please take Tylenol ibuprofen every 6 hours needed for pain.  I have attached a work note for you in case you need it.  Please follow-up with primary care provider in the next week to be reassessed to make sure symptoms are improving.  Please drink plenty of fluids and eat food as you normally would.  If symptoms change or worsen please return to ER.

## 2023-08-13 NOTE — ED Provider Notes (Signed)
[place] [facility]MENT AT [facility] Provider Note   CSN: [id number] Arrival date & time: 08/13/23  1859     History  Chief Complaint  Patient presents with   Cough    Noah Duke is a 32 y.o. male no pertinent past medical history presented with a cough for the past 4 days with nasal drainage.  Patient states that he had sinus-like symptoms last week and was taking old amoxicillin to no relief.  Patient notes he has had fevers up to 100.4 F at home and that it these have resolved with Tylenol or ibuprofen.  Patient be able to eat and drink without issue denies any neck stiffness or pain or altered mental status or chest pain or dysphagia.  Patient did take COVID test at home which was negative.  Patient denies sick contacts.  Patient states this feels very similar to previous pneumonia.  Patient is coughing up yellow sputum.   Home Medications Prior to Admission medications   Medication Sig Start Date End Date Taking? Authorizing Provider  doxycycline (VIBRAMYCIN) 100 MG capsule Take 1 capsule (100 mg total) by mouth 2 (two) times daily. 08/13/23  Yes Merit Gadsby, Fayrene Fearing T, PA-C  amLODipine (NORVASC) 10 MG tablet Take 1 tablet (10 mg total) by mouth daily. 02/02/21   Claiborne Rigg, NP  calamine lotion Apply 1 application topically as needed for itching.    [provider]  clobetasol cream (TEMOVATE) 0.05 % Apply 1 application topically 2 (two) times daily. 06/16/20   Claiborne Rigg, NP  sertraline (ZOLOFT) 100 MG tablet Take 1 tablet (100 mg total) by mouth daily. 02/02/21   Claiborne Rigg, NP  Vitamin D, Ergocalciferol, (DRISDOL) 1.25 MG (50000 UNIT) CAPS capsule Take 1 capsule (50,000 Units total) by mouth every 7 (seven) days. 04/11/20   Claiborne Rigg, NP      Allergies    Other and Poison ivy extract    Review of Systems   Review of Systems  Respiratory:  Positive for cough.     Physical Exam Updated Vital Signs BP (!) 158/94   Pulse 91    Temp 99.7 F (37.6 C)   Resp 18   Ht 5\' 11"  (1.803 m)   Wt 86.2 kg   SpO2 96%   BMI 26.50 kg/m  Physical Exam Vitals reviewed.  Constitutional:      General: He is not in acute distress.    Comments: Eating [employer]  HENT:     Head: Normocephalic and atraumatic.     Right Ear: Tympanic membrane, ear canal and external ear normal.     Left Ear: Tympanic membrane, ear canal and external ear normal.     Nose: Congestion and rhinorrhea present.     Mouth/Throat:     Mouth: Mucous membranes are moist.     Pharynx: No oropharyngeal exudate or posterior oropharyngeal erythema.     Comments: No PTA Eyes:     Extraocular Movements: Extraocular movements intact.     Conjunctiva/sclera: Conjunctivae normal.     Pupils: Pupils are equal, round, and reactive to light.  Cardiovascular:     Rate and Rhythm: Normal rate and regular rhythm.     Pulses: Normal pulses.     Heart sounds: Normal heart sounds.     Comments: 2+ bilateral radial/dorsalis pedis pulses with regular rate Pulmonary:     Effort: Pulmonary effort is normal. No respiratory distress.     Breath sounds: Normal breath sounds.  Comments: Nonproductive cough in the room Abdominal:     Palpations: Abdomen is soft.     Tenderness: There is no abdominal tenderness. There is no guarding or rebound.  Musculoskeletal:        General: Normal range of motion.     Cervical back: Normal range of motion and neck supple.     Comments: 5 out of 5 bilateral grip/leg extension strength  Skin:    General: Skin is warm and dry.     Capillary Refill: Capillary refill takes less than 2 seconds.  Neurological:     General: No focal deficit present.     Mental Status: He is alert and oriented to person, place, and time.     Comments: Sensation intact in all 4 limbs  Psychiatric:        Mood and Affect: Mood normal.     ED Results / Procedures / Treatments   Labs (all labs ordered are listed, but only abnormal results are  displayed) Labs Reviewed  RESP PANEL BY RT-PCR (RSV, FLU A&B, COVID)  [id number]    EKG None  Radiology DG Chest 2 View  Result Date: 08/13/2023 CLINICAL DATA:  Sick with nasal discharge x4 days. EXAM: CHEST - 2 VIEW COMPARISON:  January 31, 2023 FINDINGS: The heart size and mediastinal contours are within normal limits. Mild infiltrate is seen along the periphery of the mid left lung, within the inferior aspect of the left upper lobe. No pleural effusion or pneumothorax is identified. The visualized skeletal structures are unremarkable. IMPRESSION: Mild left upper lobe infiltrate. Electronically Signed   By: Aram Candela M.D.   On: 08/13/2023 22:18    Procedures Procedures    Medications Ordered in ED Medications  doxycycline (VIBRA-TABS) tablet 100 mg (100 mg Oral Given 08/13/23 2229)    ED Course/ Medical Decision Making/ A&P                                 Medical Decision Making Amount and/or Complexity of Data Reviewed Radiology: ordered.  Risk Prescription drug management.   Lucky Rathke 32 y.o. presented today for URI like symptoms. Working DDx that I considered at this time includes, but not limited to, pneumonia, viral illness, pharyngitis, mono, sinusitis, electrolyte abnormality, AOM.  R/o DDx: viral illness, pharyngitis, mono, sinusitis, electrolyte abnormality, AOM: these diagnoses are not consistent with patient's history, presentation, physical exam, labs/imaging findings.  Review of prior external notes: 01/31/2023 ED  Unique Tests and My Interpretation:  Respiratory Panel: Negative Chest x-ray: Suspected pneumonia  Discussion with Independent Historian: None  Discussion of Management of Tests: None  Risk: Medium: prescription drug management  Risk Stratification Score: None  Plan: On exam patient was in no acute distress stable vitals.  Patient feels exam was largely unremarkable however patient did have nonproductive cough in the room.   Respiratory panel was negative however chest x-ray does show suspected pneumonia and so we will give 1 dose of doxycycline here in prescribed the rest.  I encouraged patient to not use his amoxicillin at home as this would not be beneficial.  Encouraged patient use Tylenol ibuprofen every 6 hours needed for pain remain hydrated and eat food as tolerated to follow-up with his primary care provider.  Patient given work note at his request.  Patient was given return precautions.patient stable for discharge at this time.  Patient verbalized understanding of plan.  This chart was dictated  using voice recognition software.  Despite best efforts to proofread,  errors can occur which can change the documentation meaning.         Final Clinical Impression(s) / ED Diagnoses Final diagnoses:  Pneumonia of left upper lobe due to infectious organism    Rx / DC Orders ED Discharge Orders          Ordered    doxycycline (VIBRAMYCIN) 100 MG capsule  2 times daily        08/13/23 2226              Remi Deter 08/13/23 2322    Benjiman Core, MD 08/13/23 [phone]

## 2023-08-13 NOTE — ED Triage Notes (Addendum)
Pt states he has been sick x 4 days with and nasal drainage.  "I think I have pneumonia" States he has been taking "old anitibiotics" amoxicillin and OTC meds.   Covid and flu home tests last week were negative.   NAD in traige

## 2023-08-13 NOTE — ED Notes (Signed)
Reviewed AVS with patient, patient expressed understanding of directions, denies further questions at this time. 

## 2023-12-26 DIAGNOSIS — F411 Generalized anxiety disorder: Secondary | ICD-10-CM | POA: Diagnosis not present

## 2023-12-26 DIAGNOSIS — F439 Reaction to severe stress, unspecified: Secondary | ICD-10-CM | POA: Diagnosis not present

## 2023-12-26 DIAGNOSIS — R1010 Upper abdominal pain, unspecified: Secondary | ICD-10-CM | POA: Diagnosis not present

## 2023-12-26 DIAGNOSIS — I10 Essential (primary) hypertension: Secondary | ICD-10-CM | POA: Diagnosis not present

## 2023-12-26 DIAGNOSIS — F4321 Adjustment disorder with depressed mood: Secondary | ICD-10-CM | POA: Diagnosis not present

## 2023-12-26 DIAGNOSIS — F410 Panic disorder [episodic paroxysmal anxiety] without agoraphobia: Secondary | ICD-10-CM | POA: Diagnosis not present

## 2023-12-26 DIAGNOSIS — K219 Gastro-esophageal reflux disease without esophagitis: Secondary | ICD-10-CM | POA: Diagnosis not present

## 2024-01-25 DIAGNOSIS — F411 Generalized anxiety disorder: Secondary | ICD-10-CM | POA: Diagnosis not present

## 2024-01-25 DIAGNOSIS — F410 Panic disorder [episodic paroxysmal anxiety] without agoraphobia: Secondary | ICD-10-CM | POA: Diagnosis not present

## 2024-01-25 DIAGNOSIS — I10 Essential (primary) hypertension: Secondary | ICD-10-CM | POA: Diagnosis not present

## 2024-03-03 DIAGNOSIS — K219 Gastro-esophageal reflux disease without esophagitis: Secondary | ICD-10-CM | POA: Diagnosis not present

## 2024-03-03 DIAGNOSIS — F411 Generalized anxiety disorder: Secondary | ICD-10-CM | POA: Diagnosis not present

## 2024-03-03 DIAGNOSIS — I10 Essential (primary) hypertension: Secondary | ICD-10-CM | POA: Diagnosis not present

## 2024-03-03 DIAGNOSIS — R197 Diarrhea, unspecified: Secondary | ICD-10-CM | POA: Diagnosis not present

## 2024-03-03 DIAGNOSIS — Z113 Encounter for screening for infections with a predominantly sexual mode of transmission: Secondary | ICD-10-CM | POA: Diagnosis not present
